# Patient Record
Sex: Male | Born: 1965 | Race: Black or African American | Hispanic: No | Marital: Single | Smoking: Current every day smoker
Health system: Southern US, Community
[De-identification: ages and names within clinical notes are randomized; demographics above are authoritative.]

## PROBLEM LIST (undated history)

## (undated) DIAGNOSIS — I10 Essential (primary) hypertension: Secondary | ICD-10-CM

## (undated) HISTORY — PX: HERNIA REPAIR: SHX51

---

## 2017-09-30 ENCOUNTER — Emergency Department (HOSPITAL_COMMUNITY)
Admission: EM | Admit: 2017-09-30 | Discharge: 2017-09-30 | Disposition: A | Discharge status: Home / Self Care | Payer: Self-pay | Attending: Emergency Medicine | Admitting: Emergency Medicine

## 2017-09-30 ENCOUNTER — Encounter (HOSPITAL_COMMUNITY): Payer: Self-pay | Admitting: Emergency Medicine

## 2017-09-30 ENCOUNTER — Other Ambulatory Visit: Payer: Self-pay

## 2017-09-30 DIAGNOSIS — F17210 Nicotine dependence, cigarettes, uncomplicated: Secondary | ICD-10-CM | POA: Insufficient documentation

## 2017-09-30 DIAGNOSIS — K047 Periapical abscess without sinus: Secondary | ICD-10-CM | POA: Insufficient documentation

## 2017-09-30 DIAGNOSIS — I10 Essential (primary) hypertension: Secondary | ICD-10-CM | POA: Insufficient documentation

## 2017-09-30 DIAGNOSIS — K0889 Other specified disorders of teeth and supporting structures: Secondary | ICD-10-CM

## 2017-09-30 HISTORY — DX: Essential (primary) hypertension: I10

## 2017-09-30 MED ORDER — CLINDAMYCIN HCL 300 MG PO CAPS: 300.0000 mg | ORAL_CAPSULE | Freq: Three times a day (TID) | ORAL | 0 refills | Status: DC

## 2017-09-30 MED ORDER — BUPIVACAINE HCL (PF) 0.5 % IJ SOLN
30.0000 mL | Freq: Once | INTRAMUSCULAR | Status: AC
  Administered 2017-09-30: 30 mL

## 2017-09-30 NOTE — ED Notes (Signed)
ED Provider at bedside. 

## 2017-09-30 NOTE — ED Notes (Signed)
Bed: WTR5 Expected date:  Expected time:  Means of arrival:  Comments: 

## 2017-09-30 NOTE — ED Triage Notes (Signed)
Pt [facility] for Left side dental pain for about 2 weeks due to broken tooth.

## 2017-09-30 NOTE — ED Provider Notes (Signed)
[facility]-EMERGENCY DEPT Provider Note   CSN: [id number] Arrival date & time: 09/30/17  [id number]     History   Chief Complaint Chief Complaint  Patient presents with  . Dental Pain    HPI Robert Howe is a 52 y.o. male.  HPI  2 weeks of increasing left upper dental pain after broken tooth. No fevers. Some mild left sided facial swelling. No recent abx. No dental follow up. Pain is severe in severity at this time. No difficulty breathing or swallowing. No other complaints    Past Medical History:  Diagnosis Date  . Hypertension     There are no active problems to display for this patient.   History reviewed. No pertinent surgical history.      Home Medications    Prior to Admission medications   Not on File    Family History No family history on file.  Social History Social History   Tobacco Use  . Smoking status: Current Every Day Smoker    Types: Cigarettes  . Smokeless tobacco: Never Used  Substance Use Topics  . Alcohol use: Not Currently  . Drug use: Not on file     Allergies   Patient has no known allergies.   Review of Systems Review of Systems  All other systems reviewed and are negative.    Physical Exam Updated Vital Signs BP (!) 153/129   Pulse 73   Temp 97.7 F (36.5 C) (Oral)   Resp 16   Ht 5\' 7"  (1.702 m)   Wt 99.8 kg (220 lb)   SpO2 100%   BMI 34.46 kg/m   Physical Exam  Constitutional: He is oriented to person, place, and time. He appears well-developed and well-nourished.  HENT:  Head: Normocephalic.  Tooth number 11 decayed and broken with remnent tooth remaining and tenderness. No gingival swelling or abscess present. Tolerating secretions. Mild left sided facial swelling. Space under his tongue his soft. Anterior neck normal.   Eyes: EOM are normal.  Neck: Normal range of motion.  Pulmonary/Chest: Effort normal.  Abdominal: He exhibits no distension.  Musculoskeletal: Normal range of  motion.  Neurological: He is alert and oriented to person, place, and time.  Psychiatric: He has a normal mood and affect.  Nursing note and vitals reviewed.    ED Treatments / Results  Labs (all labs ordered are listed, but only abnormal results are displayed) Labs Reviewed - No data to display  EKG None  Radiology No results found.  Procedures .Nerve Block Performed by: Azalia Bilisampos, Ania Levay, MD Authorized by: Azalia Bilisampos, Marijose Curington, MD     NERVE BLOCK Performed by: Azalia BilisKevin Franz Svec Consent: Verbal consent obtained. Required items: required blood products, implants, devices, and special equipment available Time out: Immediately prior to procedure a "time out" was called to verify the correct patient, procedure, equipment, support staff and site/side marked as required. Indication: dental pain Nerve block body site: infraorbital nerve Preparation: Patient was prepped and draped in the usual sterile fashion. Needle gauge: 24 G Location technique: anatomical landmarks Local anesthetic: marcaine 0.5% without Anesthetic total: 4 ml Outcome: pain improved Patient tolerance: Patient tolerated the procedure well with no immediate complications.     Medications Ordered in ED Medications  bupivacaine (MARCAINE) 0.5 % injection 30 mL (has no administration in time range)     Initial Impression / Assessment and Plan / ED Course  I have reviewed the triage vital signs and the nursing notes.  Pertinent labs & imaging results that were  available during my care of the patient were reviewed by me and considered in my medical decision making (see chart for details).    Nerve block for pain control. Outpatient dental follow up. Home with abx.   No signs of gingival abscess. Tolerating secretions. Airway patent. No sub lingular swelling    Final Clinical Impressions(s) / ED Diagnoses   Final diagnoses:  None    ED Discharge Orders    None       Azalia Bilis, MD 09/30/17 [phone]

## 2017-09-30 NOTE — Discharge Instructions (Addendum)
Please call a dentist for follow up.  Take the antibiotics as prescribed

## 2018-11-01 ENCOUNTER — Other Ambulatory Visit: Payer: Self-pay

## 2018-11-01 DIAGNOSIS — Z20822 Contact with and (suspected) exposure to covid-19: Secondary | ICD-10-CM

## 2018-11-02 LAB — NOVEL CORONAVIRUS, NAA: SARS-CoV-2, NAA: NOT DETECTED

## 2019-05-28 ENCOUNTER — Encounter (HOSPITAL_COMMUNITY): Payer: Self-pay | Admitting: Emergency Medicine

## 2019-05-28 ENCOUNTER — Emergency Department (HOSPITAL_COMMUNITY)
Admission: EM | Admit: 2019-05-28 | Discharge: 2019-05-28 | Disposition: A | Discharge status: Home / Self Care | Payer: Self-pay | Attending: Emergency Medicine | Admitting: Emergency Medicine

## 2019-05-28 DIAGNOSIS — I10 Essential (primary) hypertension: Secondary | ICD-10-CM | POA: Insufficient documentation

## 2019-05-28 DIAGNOSIS — K0889 Other specified disorders of teeth and supporting structures: Secondary | ICD-10-CM | POA: Insufficient documentation

## 2019-05-28 DIAGNOSIS — F17210 Nicotine dependence, cigarettes, uncomplicated: Secondary | ICD-10-CM | POA: Insufficient documentation

## 2019-05-28 MED ORDER — ACETAMINOPHEN 325 MG PO TABS
325.0000 mg | ORAL_TABLET | Freq: Once | ORAL | Status: AC
Start: 2019-05-28 — End: 2019-05-28
  Administered 2019-05-28: 325 mg via ORAL
  Filled 2019-05-28: qty 1

## 2019-05-28 MED ORDER — OXYCODONE-ACETAMINOPHEN 5-325 MG PO TABS
1.0000 | ORAL_TABLET | Freq: Once | ORAL | Status: AC
  Administered 2019-05-28: 1 via ORAL
  Filled 2019-05-28: qty 1

## 2019-05-28 MED ORDER — CLINDAMYCIN HCL 300 MG PO CAPS: 300.0000 mg | ORAL_CAPSULE | Freq: Three times a day (TID) | ORAL | 0 refills | Status: DC

## 2019-05-28 MED ORDER — CLINDAMYCIN HCL 150 MG PO CAPS
300.0000 mg | ORAL_CAPSULE | Freq: Once | ORAL | Status: AC
  Administered 2019-05-28: 300 mg via ORAL
  Filled 2019-05-28: qty 2

## 2019-05-28 NOTE — ED Notes (Signed)
Pt was discharged from the ED. Pt read and understood discharge paperwork. Pt had vital signs completed. Pt conscious, breathing, and A&Ox4. No distress noted. Pt speaking in complete sentences. Pt ambulated out of the ED with a smooth and steady gait. E-signature not available.  

## 2019-05-28 NOTE — Discharge Instructions (Addendum)
Once you have a tooth get infected once it is only a matter of time until it gets infected and becomes painful again. It is important that you get this fixed to prevent dental pain like this in the future. Please follow the follow-up instructions with the on-call dentist.  In addition for future reference I have given you the dental resource guide.   While in the ED your blood pressure was high.  Please follow up with your primary care doctor or the wellness clinic for repeat evaluation as you may need medication.  High blood pressure can cause long term, potentially serious, damage if left untreated.   Please take Ibuprofen (Advil, motrin) and Tylenol (acetaminophen) to relieve your pain.  You may take up to 600 MG (3 pills) of normal strength ibuprofen every 8 hours as needed.  In between doses of ibuprofen you make take tylenol, up to 1,000 mg (two extra strength pills).  Do not take more than 3,000 mg tylenol in a 24 hour period.  Please check all medication labels as many medications such as pain and cold medications may contain tylenol.  Do not drink alcohol while taking these medications.  Do not take other NSAID'S while taking ibuprofen (such as aleve or naproxen).  Please take ibuprofen with food to decrease stomach upset.  Today you received medications that may make you sleepy or impair your ability to make decisions.  For the next 24 hours please do not drive, operate heavy machinery, care for a small child with out another adult present, or perform any activities that may cause harm to you or someone else if you were to fall asleep or be impaired.   You are being prescribed a medication which may make you sleepy. Please follow up of listed precautions for at least 24 hours after taking one dose.  You may have diarrhea from the antibiotics.  It is very important that you continue to take the antibiotics even if you get diarrhea unless a medical professional tells you that you may stop taking  them.  If you stop too early the bacteria you are being treated for will become stronger and you may need different, more powerful antibiotics that have more side effects and worsening diarrhea.  Please stay well hydrated and consider probiotics as they may decrease the severity of your diarrhea.

## 2019-05-28 NOTE — ED Provider Notes (Signed)
[facility] EMERGENCY DEPARTMENT Provider Note   CSN: [id number] Arrival date & time: 05/28/19  1836     History Chief Complaint  Patient presents with  . Dental Pain    Robert Howe is a 54 y.o. male with a past medical history of hypertension who presents today for evaluation of left upper dental pain.  He reports the pain started yesterday and has progressed throughout the day.  He states he has tried ibuprofen without resolution of his pain. Chart review shows that he was seen for similar 7/19, he states that this is the same tooth and feels the same.  He did not follow-up with a dentist after the last time. He denies any fevers.  No difficulty swallowing or breathing.  No chest pain, or neck pain.    HPI     Past Medical History:  Diagnosis Date  . Hypertension     There are no problems to display for this patient.   History reviewed. No pertinent surgical history.     No family history on file.  Social History   Tobacco Use  . Smoking status: Current Every Day Smoker    Types: Cigarettes  . Smokeless tobacco: Never Used  Substance Use Topics  . Alcohol use: Not Currently  . Drug use: Not on file    Home Medications Prior to Admission medications   Medication Sig Start Date End Date Taking? Authorizing Provider  clindamycin (CLEOCIN) 300 MG capsule Take 1 capsule (300 mg total) by mouth 3 (three) times daily. 05/28/19   Cristina Gong, PA-C    Allergies    Penicillins  Review of Systems   Review of Systems  Constitutional: Negative for chills and fever.  HENT: Positive for dental problem. Negative for drooling, trouble swallowing and voice change.   Respiratory: Negative for shortness of breath.   Gastrointestinal: Negative for abdominal pain.  Musculoskeletal: Negative for back pain.  Neurological: Negative for weakness and headaches.    Physical Exam Updated Vital Signs BP (!) 152/98 (BP Location: Right Arm)   Pulse  98   Temp 98.2 F (36.8 C)   Resp 18   SpO2 98%   Physical Exam Vitals and nursing note reviewed.  Constitutional:      Comments: Appears uncomfortable  HENT:     Head: Normocephalic and atraumatic.     Comments: There is no visualized facial swelling.  No elevation of the floor the mouth or submandibular swelling.      Left Ear: Tympanic membrane, ear canal and external ear normal.     Mouth/Throat:     Mouth: Mucous membranes are moist.     Dentition: No gingival swelling or dental abscesses.     Pharynx: Oropharynx is clear. Uvula midline. No pharyngeal swelling, posterior oropharyngeal erythema or uvula swelling.     Comments: Teeth are in generally poor state of dentition with multiple broken and carious teeth.  He has pain along his left maxillary molars and premolars. Cardiovascular:     Rate and Rhythm: Normal rate.  Pulmonary:     Effort: Pulmonary effort is normal. No respiratory distress.  Musculoskeletal:     Cervical back: Full passive range of motion without pain, normal range of motion and neck supple.  Lymphadenopathy:     Cervical: No cervical adenopathy.  Skin:    General: Skin is warm and dry.  Neurological:     Mental Status: He is alert.     Comments: Patient is awake and  alert.  No evidence of ataxia or difficulties with walking.  Psychiatric:        Mood and Affect: Mood normal.     ED Results / Procedures / Treatments   Labs (all labs ordered are listed, but only abnormal results are displayed) Labs Reviewed - No data to display  EKG None  Radiology No results found.  Procedures Procedures (including critical care time)  Medications Ordered in ED Medications  oxyCODONE-acetaminophen (PERCOCET/ROXICET) 5-325 MG per tablet 1 tablet (1 tablet Oral Given 05/28/19 2105)  clindamycin (CLEOCIN) capsule 300 mg (300 mg Oral Given 05/28/19 2104)  acetaminophen (TYLENOL) tablet 325 mg (325 mg Oral Given 05/28/19 2105)    ED Course  I have reviewed  the triage vital signs and the nursing notes.  Pertinent labs & imaging results that were available during my care of the patient were reviewed by me and considered in my medical decision making (see chart for details).    MDM Rules/Calculators/A&P                     Patient is a 54 year old man who presents today for evaluation of approximately 24 hours of dental pain.  On exam he does not have any significant facial swelling.  He does not have difficulty handling his secretions and is able to swallow pills in the emergency room without difficulty.  He appears to be uncomfortable however is otherwise well-appearing.  Teeth are in poor state.  He had a similar issue in the same tooth in 2019. Given sudden exacerbation and pain suspect underlying dental infection.  He does not have any facial rash that would be suggestive of shingles at this time.   In the emergency room he is treated with Percocet and Tylenol as he states he has recently taken NSAIDs.  He is afebrile not tachycardic or tachypneic.  He is also given his first dose of clindamycin as he anaphylaxis to penicillins. He is noted to be hypertensive here.  This was discussed with him along with the need for follow-up. He is given the information for the dentist on-call along with follow-up instructions.  Return precautions were discussed with patient who states their understanding.  At the time of discharge patient denied any unaddressed complaints or concerns.  Patient is agreeable for discharge home.  Note: Portions of this report may have been transcribed using voice recognition software. Every effort was made to ensure accuracy; however, inadvertent computerized transcription errors may be present   Final Clinical Impression(s) / ED Diagnoses Final diagnoses:  Pain, dental  Hypertension, unspecified type    Rx / DC Orders ED Discharge Orders         Ordered    clindamycin (CLEOCIN) 300 MG capsule  3 times daily     05/28/19  2059           Lorin Glass, [place] 05/29/19 0059    Nat Christen, MD 05/29/19 1452

## 2019-05-28 NOTE — ED Triage Notes (Signed)
Pt here with pain to the upper left mouth , pt has a bad tooth in  That area

## 2019-08-22 ENCOUNTER — Encounter (HOSPITAL_COMMUNITY): Payer: Self-pay | Admitting: Emergency Medicine

## 2019-08-22 ENCOUNTER — Inpatient Hospital Stay (HOSPITAL_COMMUNITY)
Admission: EM | Admit: 2019-08-22 | Discharge: 2019-08-24 | DRG: 390 | Disposition: A | Discharge status: Home / Self Care | Payer: Self-pay | Attending: Internal Medicine | Admitting: Internal Medicine

## 2019-08-22 DIAGNOSIS — Z880 Allergy status to penicillin: Secondary | ICD-10-CM

## 2019-08-22 DIAGNOSIS — D751 Secondary polycythemia: Secondary | ICD-10-CM

## 2019-08-22 DIAGNOSIS — K913 Postprocedural intestinal obstruction, unspecified as to partial versus complete: Secondary | ICD-10-CM

## 2019-08-22 DIAGNOSIS — Z20822 Contact with and (suspected) exposure to covid-19: Secondary | ICD-10-CM | POA: Diagnosis present

## 2019-08-22 DIAGNOSIS — K56609 Unspecified intestinal obstruction, unspecified as to partial versus complete obstruction: Secondary | ICD-10-CM | POA: Diagnosis present

## 2019-08-22 DIAGNOSIS — Z79899 Other long term (current) drug therapy: Secondary | ICD-10-CM

## 2019-08-22 DIAGNOSIS — Z8249 Family history of ischemic heart disease and other diseases of the circulatory system: Secondary | ICD-10-CM

## 2019-08-22 DIAGNOSIS — D7589 Other specified diseases of blood and blood-forming organs: Secondary | ICD-10-CM | POA: Diagnosis present

## 2019-08-22 DIAGNOSIS — R17 Unspecified jaundice: Secondary | ICD-10-CM

## 2019-08-22 DIAGNOSIS — K5651 Intestinal adhesions [bands], with partial obstruction: Principal | ICD-10-CM | POA: Diagnosis present

## 2019-08-22 DIAGNOSIS — K56600 Partial intestinal obstruction, unspecified as to cause: Secondary | ICD-10-CM

## 2019-08-22 DIAGNOSIS — K2950 Unspecified chronic gastritis without bleeding: Secondary | ICD-10-CM | POA: Diagnosis present

## 2019-08-22 DIAGNOSIS — F17210 Nicotine dependence, cigarettes, uncomplicated: Secondary | ICD-10-CM | POA: Diagnosis present

## 2019-08-22 DIAGNOSIS — I10 Essential (primary) hypertension: Secondary | ICD-10-CM | POA: Diagnosis present

## 2019-08-22 LAB — COMPREHENSIVE METABOLIC PANEL
ALT: 37 U/L (ref 0–44)
AST: 18 U/L (ref 15–41)
Albumin: 4.5 g/dL (ref 3.5–5.0)
Alkaline Phosphatase: 49 U/L (ref 38–126)
Anion gap: 13 (ref 5–15)
BUN: 19 mg/dL (ref 6–20)
CO2: 25 mmol/L (ref 22–32)
Calcium: 9.9 mg/dL (ref 8.9–10.3)
Chloride: 99 mmol/L (ref 98–111)
Creatinine, Ser: 0.95 mg/dL (ref 0.61–1.24)
GFR calc Af Amer: >60 mL/min (ref >60)
GFR calc non Af Amer: >60 mL/min (ref >60)
Glucose, Bld: 112 mg/dL — ABNORMAL HIGH (ref 70–99)
Potassium: 4.6 mmol/L (ref 3.5–5.1)
Sodium: 137 mmol/L (ref 135–145)
Total Bilirubin: 1.6 mg/dL — ABNORMAL HIGH (ref 0.3–1.2)
Total Protein: 9.3 g/dL — ABNORMAL HIGH (ref 6.5–8.1)

## 2019-08-22 LAB — CBC
HCT: 55.2 % — ABNORMAL HIGH (ref 39.0–52.0)
Hemoglobin: 18.4 g/dL — ABNORMAL HIGH (ref 13.0–17.0)
MCH: 34.3 pg — ABNORMAL HIGH (ref 26.0–34.0)
MCHC: 33.3 g/dL (ref 30.0–36.0)
MCV: 103 fL — ABNORMAL HIGH (ref 80.0–100.0)
Platelets: 234 10*3/uL (ref 150–400)
RBC: 5.36 MIL/uL (ref 4.22–5.81)
RDW: 12.2 % (ref 11.5–15.5)
WBC: 5 10*3/uL (ref 4.0–10.5)
nRBC: 0 % (ref 0.0–0.2)

## 2019-08-22 LAB — LIPASE, BLOOD: Lipase: 23 U/L (ref 11–51)

## 2019-08-22 MED ORDER — SODIUM CHLORIDE 0.9% FLUSH
3.0000 mL | Freq: Once | INTRAVENOUS | Status: AC
  Administered 2019-08-23: 3 mL via INTRAVENOUS

## 2019-08-22 MED ORDER — MORPHINE SULFATE (PF) 4 MG/ML IV SOLN
4.0000 mg | Freq: Once | INTRAVENOUS | Status: AC
  Administered 2019-08-23: 4 mg via INTRAVENOUS
  Filled 2019-08-22: qty 1

## 2019-08-22 MED ORDER — ONDANSETRON HCL 4 MG/2ML IJ SOLN
4.0000 mg | Freq: Once | INTRAMUSCULAR | Status: AC
  Administered 2019-08-23: 4 mg via INTRAVENOUS
  Filled 2019-08-22: qty 2

## 2019-08-22 NOTE — ED Notes (Signed)
Unable to collect labs PT currently not in lobby

## 2019-08-22 NOTE — ED Provider Notes (Signed)
[facility]-EMERGENCY DEPT Provider Note   CSN: [id number] Arrival date & time: 08/22/19  1540   History Chief Complaint  Patient presents with  . Abdominal Pain    Robert Howe is a 54 y.o. male.  The history is provided by the patient.  Abdominal Pain He has history of hypertension and comes in complaining of abdominal pain for the last 2 days.  Pain is in the mid abdomen without radiation.  It is severe and he rates it at 9/10.  It is worse if he lays on either side, somewhat better if he lays on his back.  There is associated nausea and vomiting.  There is no blood in the emesis.  There is no relief of pain with emesis.  He denies having any bowel movements or passing any flatus.  He does have history of 2 abdominal surgeries for hernia repair.  He denies fever, chills, sweats.  He did take a dose of oxycodone without relief.  Past Medical History:  Diagnosis Date  . Hypertension     There are no problems to display for this patient.   History reviewed. No pertinent surgical history.     No family history on file.  Social History   Tobacco Use  . Smoking status: Current Every Day Smoker    Types: Cigarettes  . Smokeless tobacco: Never Used  Substance Use Topics  . Alcohol use: Not Currently  . Drug use: Not on file    Home Medications Prior to Admission medications   Medication Sig Start Date End Date Taking? Authorizing Provider  clindamycin (CLEOCIN) 300 MG capsule Take 1 capsule (300 mg total) by mouth 3 (three) times daily. 05/28/19   Cristina Gong, PA-C    Allergies    Penicillins  Review of Systems   Review of Systems  Gastrointestinal: Positive for abdominal pain.  All other systems reviewed and are negative.   Physical Exam Updated Vital Signs BP (!) 139/92   Pulse 94   Temp 98.9 F (37.2 C) (Oral)   Resp 17   Ht 5\' 7"  (1.702 m)   Wt 95 kg   SpO2 98%   BMI 32.80 kg/m   Physical Exam Vitals and nursing  note reviewed.   54 year old male, resting comfortably and in no acute distress. Vital signs are significant for borderline elevated blood pressure. Oxygen saturation is 98%, which is normal. Head is normocephalic and atraumatic. PERRLA, EOMI. Oropharynx is clear. Neck is nontender and supple without adenopathy or JVD. Back is nontender and there is no CVA tenderness. Lungs are clear without rales, wheezes, or rhonchi. Chest is nontender. Heart has regular rate and rhythm without murmur. Abdomen is soft, flat, with mild to moderate tenderness rather diffusely.  There is no rebound or guarding.  There are no masses or hepatosplenomegaly and peristalsis is hypoactive. Extremities have no cyanosis or edema, full range of motion is present. Skin is warm and dry without rash. Neurologic: Mental status is normal, cranial nerves are intact, there are no motor or sensory deficits.  ED Results / Procedures / Treatments   Labs (all labs ordered are listed, but only abnormal results are displayed) Labs Reviewed  COMPREHENSIVE METABOLIC PANEL - Abnormal; Notable for the following components:      Result Value   Glucose, Bld 112 (*)    Total Protein 9.3 (*)    Total Bilirubin 1.6 (*)    All other components within normal limits  CBC - Abnormal;  Notable for the following components:   Hemoglobin 18.4 (*)    HCT 55.2 (*)    MCV 103.0 (*)    MCH 34.3 (*)    All other components within normal limits  SARS CORONAVIRUS 2 BY RT PCR (HOSPITAL ORDER, PERFORMED IN [facility])  LIPASE, BLOOD  URINALYSIS, ROUTINE W REFLEX MICROSCOPIC   Radiology CT ABDOMEN PELVIS W CONTRAST  Result Date: 08/23/2019 CLINICAL DATA:  Bowel obstruction suspected EXAM: CT ABDOMEN AND PELVIS WITH CONTRAST TECHNIQUE: Multidetector CT imaging of the abdomen and pelvis was performed using the standard protocol following bolus administration of intravenous contrast. CONTRAST:  OMNIPAQUE IOHEXOL 300 MG/ML  SOLN  COMPARISON:  None FINDINGS: Lower chest: Atelectatic changes in the otherwise clear lung bases. Small fat containing right Bochdalek's hernia. Normal heart size. No pericardial effusion. Hepatobiliary: Diffuse hepatic hypoattenuation compatible with hepatic steatosis. Sparing along the gallbladder fossa smooth liver surface contour. No concerning focal liver lesions. Normal gallbladder and biliary tree without visible calcified gallstone. Pancreas: Insert pancreas Spleen: Normal in size without focal abnormality. Adrenals/Urinary Tract: Normal adrenal glands. Kidneys are normally located with symmetric enhancement and excretion. There are multiple small fluid attenuation probable simple cyst as well as additional subcentimeter hypertension foci in both kidneys too small to fully characterize on CT imaging but statistically likely benign. No suspicious renal lesion, urolithiasis or hydronephrosis. Bladder is unremarkable aside from indentation of the bladder base by a borderline enlarged prostate. Stomach/Bowel: Distal esophagus unremarkable. There is slight prominence of the rugal folds with some intramural fat noted in the stomach and slight mucosal hyperemia. Can be seen in the setting of chronic gastritis. Duodenum is normal. Portion of the proximal small bowel is normal caliber but with abrupt distention beginning in the left lower quadrant (2/49 and proceeding through multiple contiguous loops of air and fluid-filled small bowel with transition to distal decompressed small bowel loops which appear closely apposed to a prior ventral mesh hernia repair (2/48) which raises concern for possible bowel obstruction secondary to adhesion. Several of the decompressed loops in this immediate vicinity demonstrates some mild mural thickening and mucosal hyperenhancement as well (5/67), nonspecific. More distal small bowel is unremarkable. A normal appendix is visualized. No colonic dilatation or wall thickening.  Vascular/Lymphatic: Atherosclerotic calcifications throughout the abdominal aorta and branch vessels. No aneurysm or ectasia. No enlarged abdominopelvic lymph nodes. Reproductive: Mild prostatomegaly. No other concerning CT abnormality the prostate or seminal vesicles. Other: Postsurgical changes from prior ventral incision with abdominal hernia mesh repair. Multiple apposed loops of small bowel potentially with site of obstruction secondary to adhesion. Musculoskeletal: No acute osseous abnormality or suspicious osseous lesion. Multilevel degenerative changes are present in the imaged portions of the spine. 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 Slight levocurvature of the lumbar spine. IMPRESSION: 1. Contiguous loops of air and fluid distended small bowel with suspected transition point at the level of several mildly thickened, decompressed small loops closely apposed to a prior ventral mesh hernia repair. Appearance is suspicious for small bowel obstruction secondary to adhesion. 2. Several of the decompressed loops in this immediate vicinity of the transition point demonstrate mural thickening and mucosal hyperemia which is a nonspecific appearance though possibly infectious or inflammatory in nature. No pneumatosis or portal venous gas 3. Slight prominence of the rugal folds with some intramural fat noted in the stomach and slight mucosal  hyperemia. Can be seen in the setting of chronic gastritis. 4. Hepatic steatosis. 5. Mild prostatomegaly. 6. 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 Left hip degeneration is more mild-to-moderate. 7. Aortic Atherosclerosis (ICD10-I70.0). Electronically Signed   By: Lovena Le M.D.   On: 08/23/2019 02:35    Procedures Procedures   Medications Ordered in  ED Medications  sodium chloride flush (NS) 0.9 % injection 3 mL (has no administration in time range)  morphine 4 MG/ML injection 4 mg (has no administration in time range)  ondansetron (ZOFRAN) injection 4 mg (has no administration in time range)    ED Course  I have reviewed the triage vital signs and the nursing notes.  Pertinent labs & imaging results that were available during my care of the patient were reviewed by me and considered in my medical decision making (see chart for details).  MDM Rules/Calculators/A&P Abdominal pain with vomiting worrisome for small bowel obstruction.  Consider diverticulitis.  Doubt appendicitis, cholecystitis, pancreatitis.  This differential does include conditions with high morbidity.  Labs obtained at triage are significant for polycythemia with hemoglobin 18.4, and elevated bilirubin of 1.6.  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  Old records are reviewed and he has no relevant past visits, no prior lab tests.  He will be sent for CT of abdomen and pelvis.  CT scan shows small bowel obstruction, probably secondary to adhesions.  Case is discussed with Dr. Hal Hope of [facility], who agrees to admit the patient.  Final Clinical Impression(s) / ED Diagnoses Final diagnoses:  None    Rx / DC Orders ED Discharge Orders    None       Delora Fuel, MD 67/61/95 [phone]

## 2019-08-22 NOTE — ED Triage Notes (Signed)
Pt c/o abd pains with n/v for 2 days. No BM in couple days. Denies urinary problems.

## 2019-08-23 ENCOUNTER — Encounter (HOSPITAL_COMMUNITY): Payer: Self-pay | Admitting: Internal Medicine

## 2019-08-23 ENCOUNTER — Inpatient Hospital Stay (HOSPITAL_COMMUNITY): Payer: PRIVATE HEALTH INSURANCE

## 2019-08-23 ENCOUNTER — Emergency Department (HOSPITAL_COMMUNITY): Payer: PRIVATE HEALTH INSURANCE

## 2019-08-23 ENCOUNTER — Other Ambulatory Visit: Payer: Self-pay

## 2019-08-23 DIAGNOSIS — K56609 Unspecified intestinal obstruction, unspecified as to partial versus complete obstruction: Secondary | ICD-10-CM | POA: Diagnosis present

## 2019-08-23 LAB — URINALYSIS, ROUTINE W REFLEX MICROSCOPIC
Bacteria, UA: NONE SEEN
Bilirubin Urine: NEGATIVE
Glucose, UA: NEGATIVE mg/dL
Hgb urine dipstick: NEGATIVE
Ketones, ur: 5 mg/dL — AB
Leukocytes,Ua: NEGATIVE
Nitrite: NEGATIVE
Protein, ur: 30 mg/dL — AB
Specific Gravity, Urine: >1.046 — ABNORMAL HIGH (ref 1.005–1.030)
pH: 5 (ref 5.0–8.0)

## 2019-08-23 LAB — COMPREHENSIVE METABOLIC PANEL
ALT: 32 U/L (ref 0–44)
AST: 13 U/L — ABNORMAL LOW (ref 15–41)
Albumin: 4 g/dL (ref 3.5–5.0)
Alkaline Phosphatase: 40 U/L (ref 38–126)
Anion gap: 9 (ref 5–15)
BUN: 19 mg/dL (ref 6–20)
CO2: 25 mmol/L (ref 22–32)
Calcium: 9.1 mg/dL (ref 8.9–10.3)
Chloride: 101 mmol/L (ref 98–111)
Creatinine, Ser: 0.78 mg/dL (ref 0.61–1.24)
GFR calc Af Amer: >60 mL/min (ref >60)
GFR calc non Af Amer: >60 mL/min (ref >60)
Glucose, Bld: 123 mg/dL — ABNORMAL HIGH (ref 70–99)
Potassium: 4.3 mmol/L (ref 3.5–5.1)
Sodium: 135 mmol/L (ref 135–145)
Total Bilirubin: 1.7 mg/dL — ABNORMAL HIGH (ref 0.3–1.2)
Total Protein: 8.5 g/dL — ABNORMAL HIGH (ref 6.5–8.1)

## 2019-08-23 LAB — CBC WITH DIFFERENTIAL/PLATELET
Abs Immature Granulocytes: 0.01 10*3/uL (ref 0.00–0.07)
Basophils Absolute: 0 10*3/uL (ref 0.0–0.1)
Basophils Relative: 0 %
Eosinophils Absolute: 0 10*3/uL (ref 0.0–0.5)
Eosinophils Relative: 1 %
HCT: 50 % (ref 39.0–52.0)
Hemoglobin: 16.7 g/dL (ref 13.0–17.0)
Immature Granulocytes: 0 %
Lymphocytes Relative: 47 %
Lymphs Abs: 2 10*3/uL (ref 0.7–4.0)
MCH: 34.3 pg — ABNORMAL HIGH (ref 26.0–34.0)
MCHC: 33.4 g/dL (ref 30.0–36.0)
MCV: 102.7 fL — ABNORMAL HIGH (ref 80.0–100.0)
Monocytes Absolute: 0.7 10*3/uL (ref 0.1–1.0)
Monocytes Relative: 15 %
Neutro Abs: 1.6 10*3/uL — ABNORMAL LOW (ref 1.7–7.7)
Neutrophils Relative %: 37 %
Platelets: 210 10*3/uL (ref 150–400)
RBC: 4.87 MIL/uL (ref 4.22–5.81)
RDW: 12.3 % (ref 11.5–15.5)
WBC: 4.4 10*3/uL (ref 4.0–10.5)
nRBC: 0 % (ref 0.0–0.2)

## 2019-08-23 LAB — HIV ANTIBODY (ROUTINE TESTING W REFLEX): HIV Screen 4th Generation wRfx: NONREACTIVE

## 2019-08-23 LAB — SARS CORONAVIRUS 2 BY RT PCR (HOSPITAL ORDER, PERFORMED IN ~~LOC~~ HOSPITAL LAB): SARS Coronavirus 2: NEGATIVE

## 2019-08-23 MED ORDER — MORPHINE SULFATE (PF) 2 MG/ML IV SOLN: 1.0000 mg | INTRAVENOUS | Status: DC | PRN

## 2019-08-23 MED ORDER — MORPHINE SULFATE (PF) 2 MG/ML IV SOLN
1.0000 mg | INTRAVENOUS | Status: DC | PRN
  Administered 2019-08-23 – 2019-08-24 (×6): 1 mg via INTRAVENOUS
  Filled 2019-08-23 (×6): qty 1

## 2019-08-23 MED ORDER — DIATRIZOATE MEGLUMINE & SODIUM 66-10 % PO SOLN
90.0000 mL | Freq: Once | ORAL | Status: AC
  Administered 2019-08-23: 90 mL via ORAL
  Filled 2019-08-23: qty 90

## 2019-08-23 MED ORDER — MORPHINE SULFATE (PF) 2 MG/ML IV SOLN
INTRAVENOUS | Status: AC
  Administered 2019-08-23: 1 mg via INTRAVENOUS
  Filled 2019-08-23: qty 1

## 2019-08-23 MED ORDER — DEXTROSE-NACL 5-0.9 % IV SOLN: INTRAVENOUS | Status: AC

## 2019-08-23 MED ORDER — HYDRALAZINE HCL 20 MG/ML IJ SOLN: 10.0000 mg | INTRAMUSCULAR | Status: DC | PRN

## 2019-08-23 MED ORDER — ONDANSETRON HCL 4 MG/2ML IJ SOLN: 4.0000 mg | Freq: Four times a day (QID) | INTRAMUSCULAR | Status: DC | PRN

## 2019-08-23 MED ORDER — SODIUM CHLORIDE (PF) 0.9 % IJ SOLN
INTRAMUSCULAR | Status: AC
  Filled 2019-08-23: qty 50

## 2019-08-23 MED ORDER — IOHEXOL 300 MG/ML  SOLN
100.0000 mL | Freq: Once | INTRAMUSCULAR | Status: AC | PRN
  Administered 2019-08-23: 100 mL via INTRAVENOUS

## 2019-08-23 MED ORDER — ONDANSETRON HCL 4 MG PO TABS: 4.0000 mg | ORAL_TABLET | Freq: Four times a day (QID) | ORAL | Status: DC | PRN

## 2019-08-23 NOTE — Consult Note (Signed)
[facility] Surgery Consult Note  Robert Howe 1966/03/04  [id number].    Requesting MD: Toniann Fail Chief Complaint/Reason for Consult: SBO HPI:  Patient is a 54 year old male who presented to [facility] with abdominal pain and bloating since Monday. Patient has a hx of two ventral hernia repairs done at the [place] and recurrent SBO. Patient reports he had to watch what he ate prior to hernia repairs as well and that if he ate certain things both prior to and after surgery he would develop obstructive symptoms. He does not report a specific chronicity of these symptoms but denies them happening monthly. He usually manages this at home but came to the ED due to worsening pain around umbilicus. He reported last BM was Monday morning and there was nothing unusual about it that he noted. Reported associated abdominal bloating and nausea/vomiting. Currently he is feeling much better and passing some flatus. He now denies pain and reports distention feels improved. PMH otherwise significant for HTN. Allergic to PCNs. No other past abdominal surgery. He works as an Personnel officer for [employer]   ROS: Review of Systems  Constitutional: Negative for chills and fever.  Respiratory: Negative for shortness of breath and wheezing.   Cardiovascular: Negative for chest pain and palpitations.  Gastrointestinal: Positive for constipation. Negative for abdominal pain, blood in stool, diarrhea, melena, nausea and vomiting.  Genitourinary: Negative for dysuria, frequency and urgency.  All other systems reviewed and are negative.   Family History  Problem Relation Age of Onset  . Hypertension Mother     Past Medical History:  Diagnosis Date  . Hypertension     Past Surgical History:  Procedure Laterality Date  . HERNIA REPAIR      Social History:  reports that he has been smoking cigarettes. He has never used smokeless tobacco. He reports previous alcohol use. No history on file for drug.  Allergies:   Allergies  Allergen Reactions  . Penicillins Anaphylaxis    Medications Prior to Admission  Medication Sig Dispense Refill  . clindamycin (CLEOCIN) 300 MG capsule Take 1 capsule (300 mg total) by mouth 3 (three) times daily. (Patient not taking: Reported on 08/23/2019) 21 capsule 0    Blood pressure 137/83, pulse 75, temperature 98.1 F (36.7 C), temperature source Oral, resp. rate 18, height 5\' 7"  (1.702 m), weight 95 kg, SpO2 92 %. Physical Exam:  General: pleasant, WD, WN AA male who is sitting in chair in NAD HEENT: Sclera are noninjected.  PERRL.  Ears and nose without any masses or lesions.  Mouth is pink and moist Heart: regular, rate, and rhythm.  Normal s1,s2. No obvious murmurs, gallops, or rubs noted.  Palpable radial and pedal pulses bilaterally Lungs: CTAB, no wheezes, rhonchi, or rales noted.  Respiratory effort nonlabored Abd: soft, NT, moderately distended, BS hypoactive, surgical scars MS: all 4 extremities are symmetrical with no cyanosis, clubbing, or edema. Skin: warm and dry with no masses, lesions, or rashes Neuro: Cranial nerves 2-12 grossly intact, sensation grossly intact  Psych: A&Ox3 with an appropriate affect.   Results for orders placed or performed during the hospital encounter of 08/22/19 (from the past 48 hour(s))  Lipase, blood     Status: None   Collection Time: 08/22/19  8:40 PM  Result Value Ref Range   Lipase 23 11 - 51 U/L    Comment: Performed at [facility], [address] [place], [place]  Comprehensive metabolic panel     Status: Abnormal  Collection Time: 08/22/19  8:40 PM  Result Value Ref Range   Sodium 137 135 - 145 mmol/L   Potassium 4.6 3.5 - 5.1 mmol/L   Chloride 99 98 - 111 mmol/L   CO2 25 22 - 32 mmol/L   Glucose, Bld 112 (H) 70 - 99 mg/dL    Comment: Glucose reference range applies only to samples taken after fasting for at least 8 hours.   BUN 19 6 - 20 mg/dL   Creatinine, Ser 0.95 0.61 - 1.24  mg/dL   Calcium 9.9 8.9 - 10.3 mg/dL   Total Protein 9.3 (H) 6.5 - 8.1 g/dL   Albumin 4.5 3.5 - 5.0 g/dL   AST 18 15 - 41 U/L   ALT 37 0 - 44 U/L   Alkaline Phosphatase 49 38 - 126 U/L   Total Bilirubin 1.6 (H) 0.3 - 1.2 mg/dL   GFR calc non Af Amer >60 >60 mL/min   GFR calc Af Amer >60 >60 mL/min   Anion gap 13 5 - 15    Comment: Performed at [facility], [place] [address], [place], [place] [address]  CBC     Status: Abnormal   Collection Time: 08/22/19  8:40 PM  Result Value Ref Range   WBC 5.0 4.0 - 10.5 K/uL   RBC 5.36 4.22 - 5.81 MIL/uL   Hemoglobin 18.4 (H) 13.0 - 17.0 g/dL   HCT 55.2 (H) 39.0 - 52.0 %   MCV 103.0 (H) 80.0 - 100.0 fL   MCH 34.3 (H) 26.0 - 34.0 pg   MCHC 33.3 30.0 - 36.0 g/dL   RDW 12.2 11.5 - 15.5 %   Platelets 234 150 - 400 K/uL   nRBC 0.0 0.0 - 0.2 %    Comment: Performed at [facility], [place] [address] [place], [place] [address]  SARS Coronavirus 2 by RT PCR (hospital order, performed in [facility] hospital lab) Nasopharyngeal Nasopharyngeal Swab     Status: None   Collection Time: 08/23/19  2:44 AM   Specimen: Nasopharyngeal Swab  Result Value Ref Range   SARS Coronavirus 2 NEGATIVE NEGATIVE    Comment: (NOTE) SARS-CoV-2 target nucleic acids are NOT DETECTED. The SARS-CoV-2 RNA is generally detectable in upper and lower respiratory specimens during the acute phase of infection. The lowest concentration of SARS-CoV-2 viral copies this assay can detect is 250 copies / mL. A negative result does not preclude SARS-CoV-2 infection and should not be used as the sole basis for treatment or other patient management decisions.  A negative result may occur with improper specimen collection / handling, submission of specimen other than nasopharyngeal swab, presence of viral mutation(s) within the areas targeted by this assay, and inadequate number of viral copies (<250 copies / mL). A negative result must be combined with  clinical observations, patient history, and epidemiological information. Fact Sheet for Patients:   StrictlyIdeas.no Fact Sheet for Healthcare Providers: BankingDealers.co.za This test is not yet approved or cleared  by the [place] FDA and has been authorized for detection and/or diagnosis of SARS-CoV-2 by FDA under an Emergency Use Authorization (EUA).  This EUA will remain in effect (meaning this test can be used) for the duration of the COVID-19 declaration under Section 564(b)(1) of the Act, 21 U.S.C. section 360bbb-3(b)(1), unless the authorization is terminated or revoked sooner. Performed at [facility], [place] [address] [place], [place] [address]   Urinalysis, Routine w reflex microscopic     Status: Abnormal  Collection Time: 08/23/19  4:20 AM  Result Value Ref Range   Color, Urine AMBER (A) YELLOW   APPearance CLEAR CLEAR   Specific Gravity, Urine >1.046 (H) 1.005 - 1.030   pH 5.0 5.0 - 8.0   Glucose, UA NEGATIVE NEGATIVE mg/dL   Hgb urine dipstick NEGATIVE NEGATIVE   Bilirubin Urine NEGATIVE NEGATIVE   Ketones, ur 5 (A) NEGATIVE mg/dL   Protein, ur 30 (A) NEGATIVE mg/dL   Nitrite NEGATIVE NEGATIVE   Leukocytes,Ua NEGATIVE NEGATIVE   RBC / HPF 0-5 0 - 5 RBC/hpf   WBC, UA 0-5 0 - 5 WBC/hpf   Bacteria, UA NONE SEEN NONE SEEN   Squamous Epithelial / LPF 0-5 0 - 5   Mucus PRESENT     Comment: Performed at [facility], [address] [place], [place] [address]  Comprehensive metabolic panel     Status: Abnormal   Collection Time: 08/23/19  7:36 AM  Result Value Ref Range   Sodium 135 135 - 145 mmol/L   Potassium 4.3 3.5 - 5.1 mmol/L   Chloride 101 98 - 111 mmol/L   CO2 25 22 - 32 mmol/L   Glucose, Bld 123 (H) 70 - 99 mg/dL    Comment: Glucose reference range applies only to samples taken after fasting for at least 8 hours.   BUN 19 6 - 20 mg/dL   Creatinine, Ser 1.94 0.61 -  1.24 mg/dL   Calcium 9.1 8.9 - 17.4 mg/dL   Total Protein 8.5 (H) 6.5 - 8.1 g/dL   Albumin 4.0 3.5 - 5.0 g/dL   AST 13 (L) 15 - 41 U/L   ALT 32 0 - 44 U/L   Alkaline Phosphatase 40 38 - 126 U/L   Total Bilirubin 1.7 (H) 0.3 - 1.2 mg/dL   GFR calc non Af Amer >60 >60 mL/min   GFR calc Af Amer >60 >60 mL/min   Anion gap 9 5 - 15    Comment: Performed at [facility], [address], [place], [place] [address]  CBC WITH DIFFERENTIAL     Status: Abnormal   Collection Time: 08/23/19  7:36 AM  Result Value Ref Range   WBC 4.4 4.0 - 10.5 K/uL   RBC 4.87 4.22 - 5.81 MIL/uL   Hemoglobin 16.7 13.0 - 17.0 g/dL   HCT 81.8 56.3 - 14.9 %   MCV 102.7 (H) 80.0 - 100.0 fL   MCH 34.3 (H) 26.0 - 34.0 pg   MCHC 33.4 30.0 - 36.0 g/dL   RDW 70.2 63.7 - 85.8 %   Platelets 210 150 - 400 K/uL   nRBC 0.0 0.0 - 0.2 %   Neutrophils Relative % 37 %   Neutro Abs 1.6 (L) 1.7 - 7.7 K/uL   Lymphocytes Relative 47 %   Lymphs Abs 2.0 0.7 - 4.0 K/uL   Monocytes Relative 15 %   Monocytes Absolute 0.7 0.1 - 1.0 K/uL   Eosinophils Relative 1 %   Eosinophils Absolute 0.0 0.0 - 0.5 K/uL   Basophils Relative 0 %   Basophils Absolute 0.0 0.0 - 0.1 K/uL   Immature Granulocytes 0 %   Abs Immature Granulocytes 0.01 0.00 - 0.07 K/uL    Comment: Performed at [facility], [address] [place], [place] [address]   CT ABDOMEN PELVIS W CONTRAST  Result Date: 08/23/2019 CLINICAL DATA:  Bowel obstruction suspected EXAM: CT ABDOMEN AND PELVIS WITH CONTRAST TECHNIQUE: Multidetector CT imaging of the abdomen and  pelvis was performed using the standard protocol following bolus administration of intravenous contrast. CONTRAST:  100mL OMNIPAQUE IOHEXOL 300 MG/ML  SOLN COMPARISON:  None FINDINGS: Lower chest: Atelectatic changes in the otherwise clear lung bases. Small fat containing right Bochdalek's hernia. Normal heart size. No pericardial effusion. Hepatobiliary: Diffuse hepatic  hypoattenuation compatible with hepatic steatosis. Sparing along the gallbladder fossa smooth liver surface contour. No concerning focal liver lesions. Normal gallbladder and biliary tree without visible calcified gallstone. Pancreas: Insert pancreas Spleen: Normal in size without focal abnormality. Adrenals/Urinary Tract: Normal adrenal glands. Kidneys are normally located with symmetric enhancement and excretion. There are multiple small fluid attenuation probable simple cyst as well as additional subcentimeter hypertension foci in both kidneys too small to fully characterize on CT imaging but statistically likely benign. No suspicious renal lesion, urolithiasis or hydronephrosis. Bladder is unremarkable aside from indentation of the bladder base by a borderline enlarged prostate. Stomach/Bowel: Distal esophagus unremarkable. There is slight prominence of the rugal folds with some intramural fat noted in the stomach and slight mucosal hyperemia. Can be seen in the setting of chronic gastritis. Duodenum is normal. Portion of the proximal small bowel is normal caliber but with abrupt distention beginning in the left lower quadrant (2/49 and proceeding through multiple contiguous loops of air and fluid-filled small bowel with transition to distal decompressed small bowel loops which appear closely apposed to a prior ventral mesh hernia repair (2/48) which raises concern for possible bowel obstruction secondary to adhesion. Several of the decompressed loops in this immediate vicinity demonstrates some mild mural thickening and mucosal hyperenhancement as well (5/67), nonspecific. More distal small bowel is unremarkable. A normal appendix is visualized. No colonic dilatation or wall thickening. Vascular/Lymphatic: Atherosclerotic calcifications throughout the abdominal aorta and branch vessels. No aneurysm or ectasia. No enlarged abdominopelvic lymph nodes. Reproductive: Mild prostatomegaly. No other concerning CT  abnormality the prostate or seminal vesicles. Other: Postsurgical changes from prior ventral incision with abdominal hernia mesh repair. Multiple apposed loops of small bowel potentially with site of obstruction secondary to adhesion. Musculoskeletal: No acute osseous abnormality or suspicious osseous lesion. Multilevel degenerative changes are present in the imaged portions of the spine. 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 Slight levocurvature of the lumbar spine. IMPRESSION: 1. Contiguous loops of air and fluid distended small bowel with suspected transition point at the level of several mildly thickened, decompressed small loops closely apposed to a prior ventral mesh hernia repair. Appearance is suspicious for small bowel obstruction secondary to adhesion. 2. Several of the decompressed loops in this immediate vicinity of the transition point demonstrate mural thickening and mucosal hyperemia which is a nonspecific appearance though possibly infectious or inflammatory in nature. No pneumatosis or portal venous gas 3. Slight prominence of the rugal folds with some intramural fat noted in the stomach and slight mucosal hyperemia. Can be seen in the setting of chronic gastritis. 4. Hepatic steatosis. 5. Mild prostatomegaly. 6. 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 Left hip degeneration is more mild-to-moderate. 7. Aortic Atherosclerosis (ICD10-I70.0). Electronically Signed   By: Kreg ShropshirePrice  DeHay M.D.   On: 08/23/2019 02:35      Assessment/Plan HTN  Hx of 2 prior ventral hernia repairs - at least one with mesh, both done [place], most recent surgery was in 2015 and was laparoscopic SBO -  CT yesterday showed sbo with some thickened bowel loops, chronic gastritis - patient reports he is feeling better  this AM, passing flatus and feels less bloated - recommend PO gastrografin for small bowel protocol with film this AM for baseline and then 8h film - ok to have ice chips and sips with meds for now - ambulate  FEN: NPO, IVF VTE: SCDs ID: no current abx  Juliet Rude, [facility] Surgery 08/23/2019, 9:31 AM Please see Amion for pager number during day hours 7:00am-4:30pm

## 2019-08-23 NOTE — Progress Notes (Signed)
54 yo M presenting with SBO. pMN admission. See H&P for full details. Currently on small bowel protocol w/o NGT. Gastrografin test ordered w/ follow testing for this afternoon. Surgery onboard. Appreciate assistance. Patient states he is feeling better. Continue otherwise as per H&P.    General: 54 y.o. male resting in bed in NAD Cardiovascular: RRR, +S1, S2, no m/g/r, equal pulses throughout Respiratory: CTABL, no w/r/r, normal WOB GI: BS+, ND, LUQ TTP mild,  no masses noted, no organomegaly noted MSK: No e/c/c Neuro: A&O x 3, no focal deficits Psyc: Appropriate interaction and affect, calm/cooperative  Teddy Spike, DO

## 2019-08-23 NOTE — H&P (Signed)
History and Physical    Robert Howe HQI:[id number] DOB: 03-26-1965 DOA: 08/22/2019  PCP: Patient, No Pcp Per  Patient coming from: Home.  Chief Complaint: Abdominal pain.  HPI: Robert Howe is a 54 y.o. male with history of hypertension previous history of laparoscopic abdominal hernia repair and also has history of recurrent small bowel obstructions presents to the ER with complaint of worsening abdominal pain over the last 3 days mostly centered around his umbilicus with nausea and vomiting last bowel movement was 3 days ago.  Denies any blood in the vomitus.  ED Course: In the ER CT abdomen pelvis shows features concerning for small bowel obstruction and patient is to be admitted for further management.  Patient was given morphine for pain relief.  Labs show polycythemia with macrocytosis Covid test is pending.  Review of Systems: As per HPI, rest all negative.   Past Medical History:  Diagnosis Date  . Hypertension     Past Surgical History:  Procedure Laterality Date  . HERNIA REPAIR       reports that he has been smoking cigarettes. He has never used smokeless tobacco. He reports previous alcohol use. No history on file for drug.  Allergies  Allergen Reactions  . Penicillins Anaphylaxis    Family History  Problem Relation Age of Onset  . Hypertension Mother     Prior to Admission medications   Medication Sig Start Date End Date Taking? Authorizing Provider  clindamycin (CLEOCIN) 300 MG capsule Take 1 capsule (300 mg total) by mouth 3 (three) times daily. Patient not taking: Reported on 08/23/2019 05/28/19   Cristina Gong, PA-C    Physical Exam: Constitutional: Moderately built and nourished. Vitals:   08/22/19 1550 08/22/19 2033 08/23/19 0010  BP: (!) 168/113 (!) 139/92 133/83  Pulse: 95 94 90  Resp: 19 17 18   Temp: 98.9 F (37.2 C)    TempSrc: Oral    SpO2: 98% 98% 92%  Weight: 95 kg    Height: 5\' 7"  (1.702 m)     Eyes: Anicteric no  pallor. ENMT: No discharge from the ears eyes nose or mouth. Neck: No mass felt.  No neck rigidity. Respiratory: No rhonchi or crepitations. Cardiovascular: S1-S2 heard. Abdomen: Soft mildly distended nontender bowel sounds present. Musculoskeletal: No edema. Skin: No rash. Neurologic: Alert awake oriented to time place and person.  Moves all extremities. Psychiatric: Appears normal per normal affect.   Labs on Admission: I have personally reviewed following labs and imaging studies  CBC: Recent Labs  Lab 08/22/19 2040  WBC 5.0  HGB 18.4*  HCT 55.2*  MCV 103.0*  PLT 234   Basic Metabolic Panel: Recent Labs  Lab 08/22/19 2040  NA 137  K 4.6  CL 99  CO2 25  GLUCOSE 112*  BUN 19  CREATININE 0.95  CALCIUM 9.9   GFR: Estimated Creatinine Clearance: 97.7 mL/min (by C-G formula based on SCr of 0.95 mg/dL). Liver Function Tests: Recent Labs  Lab 08/22/19 2040  AST 18  ALT 37  ALKPHOS 49  BILITOT 1.6*  PROT 9.3*  ALBUMIN 4.5   Recent Labs  Lab 08/22/19 2040  LIPASE 23   No results for input(s): AMMONIA in the last 168 hours. Coagulation Profile: No results for input(s): INR, PROTIME in the last 168 hours. Cardiac Enzymes: No results for input(s): CKTOTAL, CKMB, CKMBINDEX, TROPONINI in the last 168 hours. BNP (last 3 results) No results for input(s): PROBNP in the last 8760 hours. HbA1C: No results  for input(s): HGBA1C in the last 72 hours. CBG: No results for input(s): GLUCAP in the last 168 hours. Lipid Profile: No results for input(s): CHOL, HDL, LDLCALC, TRIG, CHOLHDL, LDLDIRECT in the last 72 hours. Thyroid Function Tests: No results for input(s): TSH, T4TOTAL, FREET4, T3FREE, THYROIDAB in the last 72 hours. Anemia Panel: No results for input(s): VITAMINB12, FOLATE, FERRITIN, TIBC, IRON, RETICCTPCT in the last 72 hours. Urine analysis: No results found for: COLORURINE, APPEARANCEUR, LABSPEC, PHURINE, GLUCOSEU, HGBUR, BILIRUBINUR, KETONESUR,  PROTEINUR, UROBILINOGEN, NITRITE, LEUKOCYTESUR Sepsis Labs: @LABRCNTIP (procalcitonin:4,lacticidven:4) )No results found for this or any previous visit (from the past 240 hour(s)).   Radiological Exams on Admission: CT ABDOMEN PELVIS W CONTRAST  Result Date: 08/23/2019 CLINICAL DATA:  Bowel obstruction suspected EXAM: CT ABDOMEN AND PELVIS WITH CONTRAST TECHNIQUE: Multidetector CT imaging of the abdomen and pelvis was performed using the standard protocol following bolus administration of intravenous contrast. CONTRAST:  10/23/2019 OMNIPAQUE IOHEXOL 300 MG/ML  SOLN COMPARISON:  None FINDINGS: Lower chest: Atelectatic changes in the otherwise clear lung bases. Small fat containing right Bochdalek's hernia. Normal heart size. No pericardial effusion. Hepatobiliary: Diffuse hepatic hypoattenuation compatible with hepatic steatosis. Sparing along the gallbladder fossa smooth liver surface contour. No concerning focal liver lesions. Normal gallbladder and biliary tree without visible calcified gallstone. Pancreas: Insert pancreas Spleen: Normal in size without focal abnormality. Adrenals/Urinary Tract: Normal adrenal glands. Kidneys are normally located with symmetric enhancement and excretion. There are multiple small fluid attenuation probable simple cyst as well as additional subcentimeter hypertension foci in both kidneys too small to fully characterize on CT imaging but statistically likely benign. No suspicious renal lesion, urolithiasis or hydronephrosis. Bladder is unremarkable aside from indentation of the bladder base by a borderline enlarged prostate. Stomach/Bowel: Distal esophagus unremarkable. There is slight prominence of the rugal folds with some intramural fat noted in the stomach and slight mucosal hyperemia. Can be seen in the setting of chronic gastritis. Duodenum is normal. Portion of the proximal small bowel is normal caliber but with abrupt distention beginning in the left lower quadrant (2/49 and  proceeding through multiple contiguous loops of air and fluid-filled small bowel with transition to distal decompressed small bowel loops which appear closely apposed to a prior ventral mesh hernia repair (2/48) which raises concern for possible bowel obstruction secondary to adhesion. Several of the decompressed loops in this immediate vicinity demonstrates some mild mural thickening and mucosal hyperenhancement as well (5/67), nonspecific. More distal small bowel is unremarkable. A normal appendix is visualized. No colonic dilatation or wall thickening. Vascular/Lymphatic: Atherosclerotic calcifications throughout the abdominal aorta and branch vessels. No aneurysm or ectasia. No enlarged abdominopelvic lymph nodes. Reproductive: Mild prostatomegaly. No other concerning CT abnormality the prostate or seminal vesicles. Other: Postsurgical changes from prior ventral incision with abdominal hernia mesh repair. Multiple apposed loops of small bowel potentially with site of obstruction secondary to adhesion. Musculoskeletal: No acute osseous abnormality or suspicious osseous lesion. Multilevel degenerative changes are present in the imaged portions of the spine. 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 Slight levocurvature of the lumbar spine. IMPRESSION: 1. Contiguous loops of air and fluid distended small bowel with suspected transition point at the level of several mildly thickened, decompressed small loops closely apposed to a prior ventral mesh hernia repair. Appearance is suspicious for small bowel obstruction secondary to adhesion. 2. Several of the decompressed loops in this immediate  vicinity of the transition point demonstrate mural thickening and mucosal hyperemia which is a nonspecific appearance though possibly infectious or inflammatory in nature. No pneumatosis or portal  venous gas 3. Slight prominence of the rugal folds with some intramural fat noted in the stomach and slight mucosal hyperemia. Can be seen in the setting of chronic gastritis. 4. Hepatic steatosis. 5. Mild prostatomegaly. 6. 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 Left hip degeneration is more mild-to-moderate. 7. Aortic Atherosclerosis (ICD10-I70.0). Electronically Signed   By: Lovena Le M.D.   On: 08/23/2019 02:35     Assessment/Plan Active Problems:   SBO (small bowel obstruction) ([facility])    1. Small bowel obstruction -could be from medications from previous surgery.  We will keep patient n.p.o. and if there is any further episodes of vomiting will place patient NG tube.  Check KUB in the morning.  General surgery consult.  Pain relief medication IV fluids. 2. History of hypertension has not been taking medicines for many months now we will keep patient on as needed IV hydralazine closely for blood pressure trends. 3. Macrocytosis.  Will need anemia panel with next blood draw. 4. Tobacco abuse.  Advised about quitting.  Patient states he drinks alcohol only twice a week.  Given that patient has small bowel obstruction will need more than 2 midnight stay in inpatient status.  Covid test is pending.   DVT prophylaxis: SCDs.  Avoiding pharmacological DVT prophylaxis in anticipation of possible procedure given that patient has small bowel obstruction. Code Status: Full code. Family Communication: Discussed with patient. Disposition Plan: Home. Consults called: Will consult surgery. Admission status: Inpatient.   Rise Patience MD [facility] Pager [phone].  If 7PM-7AM, please contact night-coverage [email or web address] Password [email or web address]  08/23/2019, 3:51 AM

## 2019-08-24 DIAGNOSIS — R809 Proteinuria, unspecified: Secondary | ICD-10-CM

## 2019-08-24 DIAGNOSIS — Z720 Tobacco use: Secondary | ICD-10-CM

## 2019-08-24 DIAGNOSIS — D7589 Other specified diseases of blood and blood-forming organs: Secondary | ICD-10-CM

## 2019-08-24 MED ORDER — IBUPROFEN 200 MG PO TABS
400.0000 mg | ORAL_TABLET | Freq: Four times a day (QID) | ORAL | Status: DC | PRN
  Filled 2019-08-24: qty 2

## 2019-08-24 MED ORDER — DOCUSATE SODIUM 100 MG PO CAPS: 100.0000 mg | ORAL_CAPSULE | Freq: Two times a day (BID) | ORAL | Status: DC

## 2019-08-24 MED ORDER — ONDANSETRON HCL 4 MG PO TABS: 4.0000 mg | ORAL_TABLET | Freq: Four times a day (QID) | ORAL | 0 refills | Status: AC | PRN

## 2019-08-24 MED ORDER — POLYETHYLENE GLYCOL 3350 17 G PO PACK: 17.0000 g | PACK | Freq: Every day | ORAL | Status: DC | PRN

## 2019-08-24 MED ORDER — DOCUSATE SODIUM 100 MG PO CAPS: 100.0000 mg | ORAL_CAPSULE | Freq: Two times a day (BID) | ORAL | 0 refills | Status: AC

## 2019-08-24 NOTE — Discharge Summary (Signed)
Physician Discharge Summary  Robert Howe DTO:[id number] DOB: 12-May-1965 DOA: 08/22/2019  PCP: Patient, No Pcp Per  Admit date: 08/22/2019 Discharge date: 08/24/2019  Admitted From: Home Disposition:  Discharged to home.   Recommendations for Outpatient Follow-up:  1. Follow up with PCP in 1-2 weeks    Discharge Condition: Stable  CODE STATUS: FULL  Diet recommendation: FLD for 1 - 2 days, then can increase.   Brief/Interim Summary: Robert Howe is a 54 y.o. male with history of hypertension previous history of laparoscopic abdominal hernia repair and also has history of recurrent small bowel obstructions presents to the ER with complaint of worsening abdominal pain over the last 3 days mostly centered around his umbilicus with nausea and vomiting last bowel movement was 3 days ago.  Denies any blood in the vomitus.  In the ER CT abdomen pelvis shows features concerning for small bowel obstruction and patient is to be admitted for further management.  Patient was given morphine for pain relief.  Labs show polycythemia with macrocytosis Covid test is pending.  6/10: S/p gastrografin. Last exam show contrast in the colon. Patient seen eating potato chips this morning. Says bowels have returned to normal. Will send with stool softener. Needs to take it slow on diet over the net couple of days. Follow up with PCP in 1 week.   Discharge Diagnoses:  Active Problems:   SBO (small bowel obstruction) ([facility])  Small bowel obstruction     - eval'd gen surgery. S/p gastrografin studies.     - no further nausea/pain     - eating chips this morning, says bowels have returned to normal     - surgery has s/o'd; ok for discharge.   History of hypertension      - has not been taking medicines for many months now     - BP ok, follow up with PCP  Macrocytosis     - follow up outpt  Tobacco abuse     - counseled against further use  Proteinuria     - follow up with PCP  Discharge  Instructions   Allergies as of 08/24/2019      Reactions   Penicillins Anaphylaxis      Medication List    STOP taking these medications   clindamycin 300 MG capsule Commonly known as: CLEOCIN     TAKE these medications   docusate sodium 100 MG capsule Commonly known as: COLACE Take 1 capsule (100 mg total) by mouth 2 (two) times daily for 7 days.   ondansetron 4 MG tablet Commonly known as: ZOFRAN Take 1 tablet (4 mg total) by mouth every 6 (six) hours as needed for up to 5 days for nausea or vomiting.       Allergies  Allergen Reactions  . Penicillins Anaphylaxis    Consultations:  General Surgery  Procedures/Studies: CT ABDOMEN PELVIS W CONTRAST  Result Date: 08/23/2019 CLINICAL DATA:  Bowel obstruction suspected EXAM: CT ABDOMEN AND PELVIS WITH CONTRAST TECHNIQUE: Multidetector CT imaging of the abdomen and pelvis was performed using the standard protocol following bolus administration of intravenous contrast. CONTRAST:  OMNIPAQUE IOHEXOL 300 MG/ML  SOLN COMPARISON:  None FINDINGS: Lower chest: Atelectatic changes in the otherwise clear lung bases. Small fat containing right Bochdalek's hernia. Normal heart size. No pericardial effusion. Hepatobiliary: Diffuse hepatic hypoattenuation compatible with hepatic steatosis. Sparing along the gallbladder fossa smooth liver surface contour. No concerning focal liver lesions. Normal gallbladder and biliary tree without visible calcified gallstone.  Pancreas: Insert pancreas Spleen: Normal in size without focal abnormality. Adrenals/Urinary Tract: Normal adrenal glands. Kidneys are normally located with symmetric enhancement and excretion. There are multiple small fluid attenuation probable simple cyst as well as additional subcentimeter hypertension foci in both kidneys too small to fully characterize on CT imaging but statistically likely benign. No suspicious renal lesion, urolithiasis or hydronephrosis. Bladder is unremarkable  aside from indentation of the bladder base by a borderline enlarged prostate. Stomach/Bowel: Distal esophagus unremarkable. There is slight prominence of the rugal folds with some intramural fat noted in the stomach and slight mucosal hyperemia. Can be seen in the setting of chronic gastritis. Duodenum is normal. Portion of the proximal small bowel is normal caliber but with abrupt distention beginning in the left lower quadrant (2/49 and proceeding through multiple contiguous loops of air and fluid-filled small bowel with transition to distal decompressed small bowel loops which appear closely apposed to a prior ventral mesh hernia repair (2/48) which raises concern for possible bowel obstruction secondary to adhesion. Several of the decompressed loops in this immediate vicinity demonstrates some mild mural thickening and mucosal hyperenhancement as well (5/67), nonspecific. More distal small bowel is unremarkable. A normal appendix is visualized. No colonic dilatation or wall thickening. Vascular/Lymphatic: Atherosclerotic calcifications throughout the abdominal aorta and branch vessels. No aneurysm or ectasia. No enlarged abdominopelvic lymph nodes. Reproductive: Mild prostatomegaly. No other concerning CT abnormality the prostate or seminal vesicles. Other: Postsurgical changes from prior ventral incision with abdominal hernia mesh repair. Multiple apposed loops of small bowel potentially with site of obstruction secondary to adhesion. Musculoskeletal: No acute osseous abnormality or suspicious osseous lesion. Multilevel degenerative changes are present in the imaged portions of the spine. 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 Slight levocurvature of the lumbar spine. IMPRESSION: 1. Contiguous loops of air and fluid distended small bowel with suspected transition point at the  level of several mildly thickened, decompressed small loops closely apposed to a prior ventral mesh hernia repair. Appearance is suspicious for small bowel obstruction secondary to adhesion. 2. Several of the decompressed loops in this immediate vicinity of the transition point demonstrate mural thickening and mucosal hyperemia which is a nonspecific appearance though possibly infectious or inflammatory in nature. No pneumatosis or portal venous gas 3. Slight prominence of the rugal folds with some intramural fat noted in the stomach and slight mucosal hyperemia. Can be seen in the setting of chronic gastritis. 4. Hepatic steatosis. 5. Mild prostatomegaly. 6. 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 Left hip degeneration is more mild-to-moderate. 7. Aortic Atherosclerosis (ICD10-I70.0). Electronically Signed   By: Kreg Shropshire M.D.   On: 08/23/2019 02:35   DG Abd Portable 1V-Small Bowel Obstruction Protocol-initial, 8 hr delay  Result Date: 08/23/2019 CLINICAL DATA:  Evaluate small bowel obstruction. EXAM: PORTABLE ABDOMEN - 1 VIEW COMPARISON:  Earlier films, same date. FINDINGS: The contrast has made it into the colon suggesting a partial small bowel obstruction. There are persistent moderately dilated small bowel loops measuring up to 4.8 cm. No free air. IMPRESSION: Findings suggest a partial small bowel obstruction. The contrast has reached the colon. Electronically Signed   By: Rudie Meyer M.D.   On: 08/23/2019 19:45   DG Abd Portable 1V  Result Date: 08/23/2019 CLINICAL DATA:  Abdominal pain EXAM: PORTABLE ABDOMEN - 1 VIEW COMPARISON:  CT from earlier in the same day. FINDINGS: Persistent small bowel dilatation is noted similar to that seen on the prior CT examination. Contrast is noted within the bladder related to the prior CT. No acute bony abnormality is noted. No free air is seen. IMPRESSION: Persistent small-bowel obstruction  Electronically Signed   By: Alcide Clever M.D.   On: 08/23/2019 12:46      Subjective: "I'm fine. I'm ready to go."  Discharge Exam: Vitals:   08/23/19 2043 08/24/19 0555  BP: 121/67 134/86  Pulse: 68 (!) 58  Resp: 16 16  Temp: 98.2 F (36.8 C) 97.8 F (36.6 C)  SpO2: 92% 91%   Vitals:   08/23/19 0645 08/23/19 1434 08/23/19 2043 08/24/19 0555  BP: 137/83 (!) 128/92 121/67 134/86  Pulse: 75 69 68 (!) 58  Resp: 18 18 16 16   Temp: 98.1 F (36.7 C) (!) 97.5 F (36.4 C) 98.2 F (36.8 C) 97.8 F (36.6 C)  TempSrc: Oral Oral Oral Oral  SpO2: 92% 95% 92% 91%  Weight:      Height:        General: 54 y.o. male resting in bed in NAD Cardiovascular: RRR, +S1, S2, no m/g/r, equal pulses throughout Respiratory: CTABL, no w/r/r, normal WOB GI: BS+, slight distention, NT, no masses noted, no organomegaly noted MSK: No e/c/c Neuro: A&O x 3, no focal deficits Psyc: Appropriate interaction and affect, calm/cooperative   The results of significant diagnostics from this hospitalization (including imaging, microbiology, ancillary and laboratory) are listed below for reference.     Microbiology: Recent Results (from the past 240 hour(s))  SARS Coronavirus 2 by RT PCR (hospital order, performed in [facility] hospital lab) Nasopharyngeal Nasopharyngeal Swab     Status: None   Collection Time: 08/23/19  2:44 AM   Specimen: Nasopharyngeal Swab  Result Value Ref Range Status   SARS Coronavirus 2 NEGATIVE NEGATIVE Final    Comment: (NOTE) SARS-CoV-2 target nucleic acids are NOT DETECTED. The SARS-CoV-2 RNA is generally detectable in upper and lower respiratory specimens during the acute phase of infection. The lowest concentration of SARS-CoV-2 viral copies this assay can detect is 250 copies / mL. A negative result does not preclude SARS-CoV-2 infection and should not be used as the sole basis for treatment or other patient management decisions.  A negative result may occur  with improper specimen collection / handling, submission of specimen other than nasopharyngeal swab, presence of viral mutation(s) within the areas targeted by this assay, and inadequate number of viral copies (<250 copies / mL). A negative result must be combined with clinical observations, patient history, and epidemiological information. Fact Sheet for Patients:   10/23/19 Fact Sheet for Healthcare Providers: BoilerBrush.com.cy This test is not yet approved or cleared  by the https://pope.com/ FDA and has been authorized for detection and/or diagnosis of SARS-CoV-2 by FDA under an Emergency Use Authorization (EUA).  This EUA will remain in effect (meaning this test can be used) for the duration of the COVID-19 declaration under Section 564(b)(1) of the Act, 21 U.S.C. section 360bbb-3(b)(1), unless the authorization is terminated or revoked sooner. Performed at [facility], [address], [place], [place]      Labs: BNP (last 3 results) No results for input(s): BNP in the last 8760 hours. Basic Metabolic Panel: Recent Labs  Lab 08/22/19 2040 08/23/19 0736  NA 137 135  K 4.6 4.3  CL 99 101  CO2 25 25  GLUCOSE 112* 123*  BUN 19 19  CREATININE 0.95 0.78  CALCIUM 9.9 9.1   Liver Function Tests: Recent Labs  Lab 08/22/19 2040 08/23/19 0736  AST 18 13*  ALT 37 32  ALKPHOS 49 40  BILITOT 1.6* 1.7*  PROT 9.3* 8.5*  ALBUMIN 4.5 4.0   Recent Labs  Lab 08/22/19 2040  LIPASE 23   No results for input(s): AMMONIA in the last 168 hours. CBC: Recent Labs  Lab 08/22/19 2040 08/23/19 0736  WBC 5.0 4.4  NEUTROABS  --  1.6*  HGB 18.4* 16.7  HCT 55.2* 50.0  MCV 103.0* 102.7*  PLT 234 210   Cardiac Enzymes: No results for input(s): CKTOTAL, CKMB, CKMBINDEX, TROPONINI in the last 168 hours. BNP: Invalid input(s): POCBNP CBG: No results for input(s): GLUCAP in the last 168  hours. D-Dimer No results for input(s): DDIMER in the last 72 hours. Hgb A1c No results for input(s): HGBA1C in the last 72 hours. Lipid Profile No results for input(s): CHOL, HDL, LDLCALC, TRIG, CHOLHDL, LDLDIRECT in the last 72 hours. Thyroid function studies No results for input(s): TSH, T4TOTAL, T3FREE, THYROIDAB in the last 72 hours.  Invalid input(s): FREET3 Anemia work up No results for input(s): VITAMINB12, FOLATE, FERRITIN, TIBC, IRON, RETICCTPCT in the last 72 hours. Urinalysis    Component Value Date/Time   COLORURINE AMBER (A) 08/23/2019 0420   APPEARANCEUR CLEAR 08/23/2019 0420   LABSPEC >1.046 (H) 08/23/2019 0420   PHURINE 5.0 08/23/2019 0420   GLUCOSEU NEGATIVE 08/23/2019 0420   HGBUR NEGATIVE 08/23/2019 0420   BILIRUBINUR NEGATIVE 08/23/2019 0420   KETONESUR 5 (A) 08/23/2019 0420   PROTEINUR 30 (A) 08/23/2019 0420   NITRITE NEGATIVE 08/23/2019 0420   LEUKOCYTESUR NEGATIVE 08/23/2019 0420   Sepsis Labs Invalid input(s): PROCALCITONIN,  WBC,  LACTICIDVEN Microbiology Recent Results (from the past 240 hour(s))  SARS Coronavirus 2 by RT PCR (hospital order, performed in [place] hospital lab) Nasopharyngeal Nasopharyngeal Swab     Status: None   Collection Time: 08/23/19  2:44 AM   Specimen: Nasopharyngeal Swab  Result Value Ref Range Status   SARS Coronavirus 2 NEGATIVE NEGATIVE Final    Comment: (NOTE) SARS-CoV-2 target nucleic acids are NOT DETECTED. The SARS-CoV-2 RNA is generally detectable in upper and lower respiratory specimens during the acute phase of infection. The lowest concentration of SARS-CoV-2 viral copies this assay can detect is 250 copies / mL. A negative result does not preclude SARS-CoV-2 infection and should not be used as the sole basis for treatment or other patient management decisions.  A negative result may occur with improper specimen collection / handling, submission of specimen other than nasopharyngeal swab, presence of  viral mutation(s) within the areas targeted by this assay, and inadequate number of viral copies (<250 copies / mL). A negative result must be combined with clinical observations, patient history, and epidemiological information. Fact Sheet for Patients:   StrictlyIdeas.no Fact Sheet for Healthcare Providers: BankingDealers.co.za This test is not yet approved or cleared  by the [place] FDA and has been authorized for detection and/or diagnosis of SARS-CoV-2 by FDA under an Emergency Use Authorization (EUA).  This EUA will remain in effect (meaning this test can be used) for the duration of the COVID-19 declaration under Section 564(b)(1) of the Act, 21 U.S.C. section 360bbb-3(b)(1), unless the authorization is terminated or revoked sooner. Performed at [facility], [place] [address] [place], [place] [address]      Time coordinating discharge: 35 minutes  SIGNED:   Jiles Prows  A Ronaldo MiyamotoKyle, DO  [facility] 08/24/2019, 1:46 PM   If 7PM-7AM, please contact night-coverage [email or web address]

## 2019-08-24 NOTE — Progress Notes (Signed)
[facility] Progress Note     Subjective: Patient is sitting in chair eating a bag of potato chips. Reports he feels like his bowel function is back to normal this AM. Denies abdominal pain or nausea. We discussed home bowel regimen and consideration of daily stool softener and/laxatives prn.   Objective: Vital signs in last 24 hours: Temp:  [97.5 F (36.4 C)-98.2 F (36.8 C)] 97.8 F (36.6 C) (06/10 0555) Pulse Rate:  [58-69] 58 (06/10 0555) Resp:  [16-18] 16 (06/10 0555) BP: (121-134)/(67-92) 134/86 (06/10 0555) SpO2:  [91 %-95 %] 91 % (06/10 0555) Last BM Date: 08/23/19  Intake/Output from previous day: 06/09 0701 - 06/10 0700 In: 793.5 [I.V.:793.5] Out: -  Intake/Output this shift: No intake/output data recorded.  PE: General: pleasant, WD, WN AA male who is sitting in chair in NAD HEENT: Sclera are noninjected.  PERRL.  Ears and nose without any masses or lesions.  Mouth is pink and moist Heart: regular, rate, and rhythm.  Normal s1,s2. No obvious murmurs, gallops, or rubs noted.  Palpable radial and pedal pulses bilaterally Lungs: CTAB, no wheezes, rhonchi, or rales noted.  Respiratory effort nonlabored Abd: soft, NT, moderately distended, +BS, surgical scars MS: all 4 extremities are symmetrical with no cyanosis, clubbing, or edema. Skin: warm and dry with no masses, lesions, or rashes Neuro: Cranial nerves 2-12 grossly intact, sensation grossly intact  Psych: A&Ox3 with an appropriate affect.    Lab Results:  Recent Labs    08/22/19 2040 08/23/19 0736  WBC 5.0 4.4  HGB 18.4* 16.7  HCT 55.2* 50.0  PLT 234 210   BMET Recent Labs    08/22/19 2040 08/23/19 0736  NA 137 135  K 4.6 4.3  CL 99 101  CO2 25 25  GLUCOSE 112* 123*  BUN 19 19  CREATININE 0.95 0.78  CALCIUM 9.9 9.1   PT/INR No results for input(s): LABPROT, INR in the last 72 hours. CMP     Component Value Date/Time   NA 135 08/23/2019 0736   K 4.3 08/23/2019 0736   CL 101  08/23/2019 0736   CO2 25 08/23/2019 0736   GLUCOSE 123 (H) 08/23/2019 0736   BUN 19 08/23/2019 0736   CREATININE 0.78 08/23/2019 0736   CALCIUM 9.1 08/23/2019 0736   PROT 8.5 (H) 08/23/2019 0736   ALBUMIN 4.0 08/23/2019 0736   AST 13 (L) 08/23/2019 0736   ALT 32 08/23/2019 0736   ALKPHOS 40 08/23/2019 0736   BILITOT 1.7 (H) 08/23/2019 0736   GFRNONAA >60 08/23/2019 0736   GFRAA >60 08/23/2019 0736   Lipase     Component Value Date/Time   LIPASE 23 08/22/2019 2040       Studies/Results: CT ABDOMEN PELVIS W CONTRAST  Result Date: 08/23/2019 CLINICAL DATA:  Bowel obstruction suspected EXAM: CT ABDOMEN AND PELVIS WITH CONTRAST TECHNIQUE: Multidetector CT imaging of the abdomen and pelvis was performed using the standard protocol following bolus administration of intravenous contrast. CONTRAST:  OMNIPAQUE IOHEXOL 300 MG/ML  SOLN COMPARISON:  None FINDINGS: Lower chest: Atelectatic changes in the otherwise clear lung bases. Small fat containing right Bochdalek's hernia. Normal heart size. No pericardial effusion. Hepatobiliary: Diffuse hepatic hypoattenuation compatible with hepatic steatosis. Sparing along the gallbladder fossa smooth liver surface contour. No concerning focal liver lesions. Normal gallbladder and biliary tree without visible calcified gallstone. Pancreas: Insert pancreas Spleen: Normal in size without focal abnormality. Adrenals/Urinary Tract: Normal adrenal glands. Kidneys are normally located with symmetric enhancement and excretion.  There are multiple small fluid attenuation probable simple cyst as well as additional subcentimeter hypertension foci in both kidneys too small to fully characterize on CT imaging but statistically likely benign. No suspicious renal lesion, urolithiasis or hydronephrosis. Bladder is unremarkable aside from indentation of the bladder base by a borderline enlarged prostate. Stomach/Bowel: Distal esophagus unremarkable. There is slight  prominence of the rugal folds with some intramural fat noted in the stomach and slight mucosal hyperemia. Can be seen in the setting of chronic gastritis. Duodenum is normal. Portion of the proximal small bowel is normal caliber but with abrupt distention beginning in the left lower quadrant (2/49 and proceeding through multiple contiguous loops of air and fluid-filled small bowel with transition to distal decompressed small bowel loops which appear closely apposed to a prior ventral mesh hernia repair (2/48) which raises concern for possible bowel obstruction secondary to adhesion. Several of the decompressed loops in this immediate vicinity demonstrates some mild mural thickening and mucosal hyperenhancement as well (5/67), nonspecific. More distal small bowel is unremarkable. A normal appendix is visualized. No colonic dilatation or wall thickening. Vascular/Lymphatic: Atherosclerotic calcifications throughout the abdominal aorta and branch vessels. No aneurysm or ectasia. No enlarged abdominopelvic lymph nodes. Reproductive: Mild prostatomegaly. No other concerning CT abnormality the prostate or seminal vesicles. Other: Postsurgical changes from prior ventral incision with abdominal hernia mesh repair. Multiple apposed loops of small bowel potentially with site of obstruction secondary to adhesion. Musculoskeletal: No acute osseous abnormality or suspicious osseous lesion. Multilevel degenerative changes are present in the imaged portions of the spine. 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 Slight levocurvature of the lumbar spine. IMPRESSION: 1. Contiguous loops of air and fluid distended small bowel with suspected transition point at the level of several mildly thickened, decompressed small loops closely apposed to a prior ventral mesh hernia repair. Appearance is suspicious  for small bowel obstruction secondary to adhesion. 2. Several of the decompressed loops in this immediate vicinity of the transition point demonstrate mural thickening and mucosal hyperemia which is a nonspecific appearance though possibly infectious or inflammatory in nature. No pneumatosis or portal venous gas 3. Slight prominence of the rugal folds with some intramural fat noted in the stomach and slight mucosal hyperemia. Can be seen in the setting of chronic gastritis. 4. Hepatic steatosis. 5. Mild prostatomegaly. 6. 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 Left hip degeneration is more mild-to-moderate. 7. Aortic Atherosclerosis (ICD10-I70.0). Electronically Signed   By: Kreg Shropshire M.D.   On: 08/23/2019 02:35   DG Abd Portable 1V-Small Bowel Obstruction Protocol-initial, 8 hr delay  Result Date: 08/23/2019 CLINICAL DATA:  Evaluate small bowel obstruction. EXAM: PORTABLE ABDOMEN - 1 VIEW COMPARISON:  Earlier films, same date. FINDINGS: The contrast has made it into the colon suggesting a partial small bowel obstruction. There are persistent moderately dilated small bowel loops measuring up to 4.8 cm. No free air. IMPRESSION: Findings suggest a partial small bowel obstruction. The contrast has reached the colon. Electronically Signed   By: Rudie Meyer M.D.   On: 08/23/2019 19:45   DG Abd Portable 1V  Result Date: 08/23/2019 CLINICAL DATA:  Abdominal pain EXAM: PORTABLE ABDOMEN - 1 VIEW COMPARISON:  CT from earlier in the same day. FINDINGS: Persistent small bowel dilatation is noted similar to that seen on the prior CT examination.  Contrast is noted within the bladder related to the prior CT. No acute bony abnormality is noted. No free air is seen. IMPRESSION: Persistent small-bowel obstruction Electronically Signed   By: Inez Catalina M.D.   On: 08/23/2019 12:46    Anti-infectives: Anti-infectives (From admission, onward)   None        Assessment/Plan HTN  Hx of 2 prior ventral hernia repairs - at least one with mesh, both done at the [place], most recent surgery was in 2015 and was laparoscopic SBO - CT 6/8 showed sbo with some thickened bowel loops, chronic gastritis - patient already eating solid food, reports feeling completely back to normal and having bowel function  - will place on regular diet since pt already tolerating this and notify primary team that he is stable for discharge from surgery perspective - no other surgical recommendations at this time  FEN: reg diet VTE: SCDs ID: no current abx   LOS: 1 day    Norm Parcel , [facility] 08/24/2019, 7:46 AM Please see Amion for pager number during day hours 7:00am-4:30pm

## 2019-10-18 ENCOUNTER — Ambulatory Visit: Payer: PRIVATE HEALTH INSURANCE | Admitting: Family Medicine

## 2019-10-18 DIAGNOSIS — Z0289 Encounter for other administrative examinations: Secondary | ICD-10-CM

## 2019-11-16 ENCOUNTER — Encounter (HOSPITAL_COMMUNITY): Payer: Self-pay | Admitting: Emergency Medicine

## 2019-11-16 ENCOUNTER — Other Ambulatory Visit: Payer: Self-pay

## 2019-11-16 ENCOUNTER — Telehealth (HOSPITAL_COMMUNITY): Payer: Self-pay

## 2019-11-16 ENCOUNTER — Telehealth (HOSPITAL_COMMUNITY): Payer: Self-pay | Admitting: *Deleted

## 2019-11-16 ENCOUNTER — Ambulatory Visit (HOSPITAL_COMMUNITY)
Admission: EM | Admit: 2019-11-16 | Discharge: 2019-11-16 | Disposition: A | Discharge status: Home / Self Care | Payer: Self-pay | Attending: Urgent Care | Admitting: Urgent Care

## 2019-11-16 DIAGNOSIS — R040 Epistaxis: Secondary | ICD-10-CM

## 2019-11-16 DIAGNOSIS — I10 Essential (primary) hypertension: Secondary | ICD-10-CM

## 2019-11-16 MED ORDER — HYDROCHLOROTHIAZIDE 12.5 MG PO TABS: 12.5000 mg | ORAL_TABLET | Freq: Every day | ORAL | 0 refills | Status: DC

## 2019-11-16 MED ORDER — HYDROCHLOROTHIAZIDE 12.5 MG PO TABS
12.5000 mg | ORAL_TABLET | Freq: Every day | ORAL | 0 refills | Status: DC
Start: 2019-11-16 — End: 2019-11-16

## 2019-11-16 NOTE — ED Triage Notes (Signed)
Pt reports BP has been high this week. He has had nosebleeds this week.  He has been out of BP meds for months due to move from [place]. He was taking HCTZ  He went to dentist today for cleaning and they refused him due to BP:: 140/96, 148/98, 150/100

## 2019-11-16 NOTE — Discharge Instructions (Addendum)
Week 1 start HCTZ (hydrochlorothiazide) at 12.5mg  once daily. Check your blood pressure every other day. The goal is to have it at 110-130 systolic (top number) and (70-80) diastolic. If it remains above these levels after 1 week, start taking 25mg  once daily (that's 2 tablets).    For diabetes or elevated blood sugar, please make sure you are avoiding starchy, carbohydrate foods like pasta, breads, pastry, rice, potatoes, desserts. These foods can elevated your blood sugar. Also, avoid sodas, sweet teas, sugary beverages, fruit juices.  Drinking plain water will be much more helpful, try 64 ounces of water daily.  It is okay to flavor your water naturally by cutting cucumber, lemon, mint or lime, placing it in a picture with water and drinking it over a period of 2 to 3 days as long as it remains refrigerated.    For elevated blood pressure, make sure you are monitoring salt in your diet.  Do not eat restaurant foods and limit processed foods at home, prepare/cook your own foods at home.  Processed foods include things like frozen meals preseasoned meats and dinners, deli meats, canned foods as they are high in sodium/salt.  Make sure your pain attention to sodium labels on foods you by at the grocery store.  For seasoning you can use a brand called Mrs. Dash which includes a lot of salt free seasonings.  Salads - kale, spinach, cabbage, spring mix; use seeds like pumpkin seeds or sunflower seeds, almonds, walnuts or pecans; you can also use 1-2 hard boiled eggs in your salads Fruits - avocadoes, berries (blueberries, raspberries, blackberries), apples, oranges, pomegranate, pear; avoid eating bananas, grapes regularly Vegetables - aspargus, cauliflower, broccoli, green beans, brussel spouts, bell peppers; stay away from starchy vegetables like potatoes, carrots, peas  Regarding meat it is better to eat lean meats and limit your red meat including pork to once a week.  Wild caught fish, chicken breast  are good options as they tend to be leaner sources of good protein.   DO NOT EAT ANY FOODS ON THIS LIST THAT YOU ARE ALLERGIC TO.

## 2019-11-16 NOTE — ED Provider Notes (Signed)
[facility]   MRN: [id number] DOB: 02-Jun-1965  Subjective:   Dylan Monforte is a 54 y.o. male presenting for check on blood pressure.  Patient states that he was getting dental work today and had multiple blood pressure readings that were elevated.  They decided to cancel his visit and recommend he come in for an office visit and clearance.  Patient is originally from [place], has been living here since 2018.  Has not establish care with a new PCP.  Was previously taking hydrochlorothiazide.  He is out of this medication.  Denies active headache, confusion, weakness, numbness or tingling, chest pain, shortness of breath, heart racing, nausea, vomiting, belly pain, hematuria, lower leg swelling.  No current facility-administered medications for this encounter. No current outpatient medications on file.   Allergies  Allergen Reactions  . Penicillins Anaphylaxis    Past Medical History:  Diagnosis Date  . Hypertension      Past Surgical History:  Procedure Laterality Date  . HERNIA REPAIR      Family History  Problem Relation Age of Onset  . Hypertension Mother     Social History   Tobacco Use  . Smoking status: Current Every Day Smoker    Types: Cigarettes  . Smokeless tobacco: Never Used  Vaping Use  . Vaping Use: Never used  Substance Use Topics  . Alcohol use: Not Currently  . Drug use: Not on file    ROS   Objective:   Vitals: BP (!) 157/103   Pulse 90   Temp 99.6 F (37.6 C) (Oral)   Resp 16   SpO2 94%   Physical Exam Constitutional:      General: He is not in acute distress.    Appearance: Normal appearance. He is well-developed. He is not ill-appearing, toxic-appearing or diaphoretic.  HENT:     Head: Normocephalic and atraumatic.     Right Ear: External ear normal.     Left Ear: External ear normal.     Nose: Nose normal.     Mouth/Throat:     Mouth: Mucous membranes are moist.     Pharynx: Oropharynx is clear.  Eyes:      General: No scleral icterus.       Right eye: No discharge.        Left eye: No discharge.     Extraocular Movements: Extraocular movements intact.     Conjunctiva/sclera: Conjunctivae normal.     Pupils: Pupils are equal, round, and reactive to light.  Cardiovascular:     Rate and Rhythm: Normal rate and regular rhythm.     Heart sounds: Normal heart sounds. No murmur heard.  No friction rub. No gallop.   Pulmonary:     Effort: Pulmonary effort is normal. No respiratory distress.     Breath sounds: Normal breath sounds. No stridor. No wheezing, rhonchi or rales.  Skin:    General: Skin is warm and dry.  Neurological:     Mental Status: He is alert and oriented to person, place, and time.     Cranial Nerves: No cranial nerve deficit.     Motor: No weakness.     Coordination: Coordination normal.     Gait: Gait normal.     Deep Tendon Reflexes: Reflexes normal.     Comments: Negative Romberg and pronator drift.  Psychiatric:        Mood and Affect: Mood normal.        Behavior: Behavior normal.  Thought Content: Thought content normal.        Judgment: Judgment normal.      Assessment and Plan :   PDMP not reviewed this encounter.  1. Essential hypertension   2. Elevated blood pressure reading in office with diagnosis of hypertension   3. Epistaxis     No signs of ACS, intracranial process.  Patient has uncontrolled hypertension, was agreeable to starting hydrochlorothiazide.  Will monitor blood pressure at home and try to meet goal of 110-130 systolic.Marland Kitchen  Dosing increase instructions provided to patient.  Recommended lifestyle modifications otherwise.  Completed his documentation to be able to have his dental work should he meet a blood pressure goal of less than 130/90.  Recommend he establish care with new PCP for further management of his hypertension, information given to him for [facility] internal medicine.  Counseled patient on potential for adverse effects with  medications prescribed/recommended today, ER and return-to-clinic precautions discussed, patient verbalized understanding.    Wallis Bamberg, [place] 11/16/19 [phone]

## 2020-03-21 ENCOUNTER — Ambulatory Visit
Admission: RE | Admit: 2020-03-21 | Discharge: 2020-03-21 | Disposition: A | Discharge status: Home / Self Care | Payer: Self-pay | Source: Ambulatory Visit | Attending: Nurse Practitioner | Admitting: Nurse Practitioner

## 2020-03-21 ENCOUNTER — Other Ambulatory Visit: Payer: Self-pay | Admitting: Nurse Practitioner

## 2020-03-21 DIAGNOSIS — M25551 Pain in right hip: Secondary | ICD-10-CM

## 2020-03-25 ENCOUNTER — Ambulatory Visit: Payer: Self-pay | Admitting: Podiatry

## 2020-04-02 ENCOUNTER — Ambulatory Visit (INDEPENDENT_AMBULATORY_CARE_PROVIDER_SITE_OTHER): Payer: Self-pay | Admitting: Podiatry

## 2020-04-02 ENCOUNTER — Other Ambulatory Visit: Payer: Self-pay

## 2020-04-02 DIAGNOSIS — Q828 Other specified congenital malformations of skin: Secondary | ICD-10-CM

## 2020-04-02 MED ORDER — KETOCONAZOLE 2 % EX CREA
1.0000 | TOPICAL_CREAM | Freq: Every day | CUTANEOUS | 2 refills | Status: DC
Start: 2020-04-02 — End: 2020-08-17

## 2020-04-02 NOTE — Patient Instructions (Signed)
Take dressing off in 8 hours and wash the foot with soap and water. If it is hurting or becomes uncomfortable before the 8 hours, go ahead and remove the bandage and wash the area.  If it blisters, apply antibiotic ointment and a band-aid.   Monitor for any signs/symptoms of infection. Call the office immediately if any occur or go directly to the emergency room. Call with any questions/concerns.   In 1 week start applying salicylic acid 10% ointment or cream. You should be able to find this over the counter or at the pharmacy

## 2020-04-02 NOTE — Progress Notes (Signed)
  Subjective:  Patient ID: Robert Howe, male    DOB: 02/20/1966,  MRN: [id number]  Chief Complaint  Patient presents with  . Plantar Warts    Left foot possible plantar wart. PT stated that it is very sore.    55 y.o. male presents with the above complaint. History confirmed with patient. Has been there for some time. He previously had it removed when he was 55 years old and has not had any issues since then until it recurred recently  Objective:  Physical Exam: warm, good capillary refill, no trophic changes or ulcerative lesions, normal DP and PT pulses and normal sensory exam. Right Foot: Porokeratosis submet 3  Assessment:   1. Porokeratosis      Plan:  Patient was evaluated and treated and all questions answered.   Discussed etiology and treatment options for porokeratosis. I recommend he treat at home daily with salicylic acid and/or urea cream. Today in the office I debrided it and excised the lesion with central core with a chisel blade and applied Cantharone to see if we can destroy the lesion. Offloading pad was applied. Post care instructions were given.  Return in about 6 weeks (around 05/14/2020), or if symptoms worsen or fail to improve.

## 2020-05-14 ENCOUNTER — Ambulatory Visit: Payer: Self-pay | Admitting: Podiatry

## 2020-05-16 ENCOUNTER — Ambulatory Visit: Payer: Self-pay | Admitting: Podiatry

## 2020-07-09 ENCOUNTER — Encounter: Payer: Self-pay | Admitting: Podiatry

## 2020-07-09 ENCOUNTER — Ambulatory Visit (INDEPENDENT_AMBULATORY_CARE_PROVIDER_SITE_OTHER): Payer: BC Managed Care – PPO | Admitting: Podiatry

## 2020-07-09 ENCOUNTER — Other Ambulatory Visit: Payer: Self-pay

## 2020-07-09 DIAGNOSIS — Z720 Tobacco use: Secondary | ICD-10-CM | POA: Insufficient documentation

## 2020-07-09 DIAGNOSIS — M1610 Unilateral primary osteoarthritis, unspecified hip: Secondary | ICD-10-CM | POA: Insufficient documentation

## 2020-07-09 DIAGNOSIS — M169 Osteoarthritis of hip, unspecified: Secondary | ICD-10-CM | POA: Insufficient documentation

## 2020-07-09 DIAGNOSIS — Q828 Other specified congenital malformations of skin: Secondary | ICD-10-CM

## 2020-07-09 DIAGNOSIS — L723 Sebaceous cyst: Secondary | ICD-10-CM | POA: Insufficient documentation

## 2020-07-09 DIAGNOSIS — K432 Incisional hernia without obstruction or gangrene: Secondary | ICD-10-CM | POA: Insufficient documentation

## 2020-07-09 NOTE — Progress Notes (Signed)
  Subjective:  Patient ID: Robert Howe, male    DOB: 07/25/1965,  MRN: [id number]  Chief Complaint  Patient presents with  . Plantar Warts     f/u plantar warts RT FT    55 y.o. male returns for follow-up with the above complaint. History confirmed with patient.  Still very painful for him.  He would like it removed  Objective:  Physical Exam: warm, good capillary refill, no trophic changes or ulcerative lesions, normal DP and PT pulses and normal sensory exam. Left foot : Porokeratosis submet 3, hallux and submit 1  Assessment:   1. Porokeratosis      Plan:  Patient was evaluated and treated and all questions answered.   Has not improved and he would like surgical excision today.  Discussed with him the risks and benefits of this including recurrence or infection.  Following local administration of 3 cc of 1% lidocaine plain with epinephrine 1:200,000, I curettage the lesion with a 2 curette to subcutaneous tissue to excise lesion completely.  Triple antibiotic ointment and salinocaine ointment was applied with a pad.  He will change the dressing with antibiotic ointment for the next 5 days.  Return in about 6 weeks (around 08/20/2020).

## 2020-08-17 ENCOUNTER — Other Ambulatory Visit: Payer: Self-pay | Admitting: Podiatry

## 2020-08-20 ENCOUNTER — Ambulatory Visit: Payer: BC Managed Care – PPO | Admitting: Podiatry

## 2020-09-12 ENCOUNTER — Emergency Department (HOSPITAL_COMMUNITY): Payer: BC Managed Care – PPO

## 2020-09-12 ENCOUNTER — Encounter (HOSPITAL_COMMUNITY): Payer: Self-pay | Admitting: Emergency Medicine

## 2020-09-12 ENCOUNTER — Emergency Department (HOSPITAL_COMMUNITY)
Admission: EM | Admit: 2020-09-12 | Discharge: 2020-09-12 | Disposition: A | Discharge status: Home / Self Care | Payer: BC Managed Care – PPO | Attending: Emergency Medicine | Admitting: Emergency Medicine

## 2020-09-12 DIAGNOSIS — K5651 Intestinal adhesions [bands], with partial obstruction: Secondary | ICD-10-CM | POA: Insufficient documentation

## 2020-09-12 DIAGNOSIS — Z79899 Other long term (current) drug therapy: Secondary | ICD-10-CM | POA: Diagnosis not present

## 2020-09-12 DIAGNOSIS — F17210 Nicotine dependence, cigarettes, uncomplicated: Secondary | ICD-10-CM | POA: Diagnosis not present

## 2020-09-12 DIAGNOSIS — I10 Essential (primary) hypertension: Secondary | ICD-10-CM | POA: Diagnosis not present

## 2020-09-12 DIAGNOSIS — Z20822 Contact with and (suspected) exposure to covid-19: Secondary | ICD-10-CM | POA: Diagnosis not present

## 2020-09-12 DIAGNOSIS — R1084 Generalized abdominal pain: Secondary | ICD-10-CM | POA: Diagnosis present

## 2020-09-12 LAB — CBC WITH DIFFERENTIAL/PLATELET
Abs Immature Granulocytes: 0.01 10*3/uL (ref 0.00–0.07)
Basophils Absolute: 0 10*3/uL (ref 0.0–0.1)
Basophils Relative: 0 %
Eosinophils Absolute: 0 10*3/uL (ref 0.0–0.5)
Eosinophils Relative: 0 %
HCT: 51.4 % (ref 39.0–52.0)
Hemoglobin: 17.1 g/dL — ABNORMAL HIGH (ref 13.0–17.0)
Immature Granulocytes: 0 %
Lymphocytes Relative: 21 %
Lymphs Abs: 1.1 10*3/uL (ref 0.7–4.0)
MCH: 33.2 pg (ref 26.0–34.0)
MCHC: 33.3 g/dL (ref 30.0–36.0)
MCV: 99.8 fL (ref 80.0–100.0)
Monocytes Absolute: 0.6 10*3/uL (ref 0.1–1.0)
Monocytes Relative: 10 %
Neutro Abs: 3.7 10*3/uL (ref 1.7–7.7)
Neutrophils Relative %: 69 %
Platelets: 236 10*3/uL (ref 150–400)
RBC: 5.15 MIL/uL (ref 4.22–5.81)
RDW: 12.4 % (ref 11.5–15.5)
WBC: 5.4 10*3/uL (ref 4.0–10.5)
nRBC: 0 % (ref 0.0–0.2)

## 2020-09-12 LAB — RESP PANEL BY RT-PCR (FLU A&B, COVID) ARPGX2
Influenza A by PCR: NEGATIVE
Influenza B by PCR: NEGATIVE
SARS Coronavirus 2 by RT PCR: NEGATIVE

## 2020-09-12 LAB — COMPREHENSIVE METABOLIC PANEL
ALT: 190 U/L — ABNORMAL HIGH (ref 0–44)
AST: 45 U/L — ABNORMAL HIGH (ref 15–41)
Albumin: 4 g/dL (ref 3.5–5.0)
Alkaline Phosphatase: 59 U/L (ref 38–126)
Anion gap: 13 (ref 5–15)
BUN: 14 mg/dL (ref 6–20)
CO2: 24 mmol/L (ref 22–32)
Calcium: 9.7 mg/dL (ref 8.9–10.3)
Chloride: 102 mmol/L (ref 98–111)
Creatinine, Ser: 0.79 mg/dL (ref 0.61–1.24)
GFR, Estimated: >60 mL/min (ref >60)
Glucose, Bld: 154 mg/dL — ABNORMAL HIGH (ref 70–99)
Potassium: 3.9 mmol/L (ref 3.5–5.1)
Sodium: 139 mmol/L (ref 135–145)
Total Bilirubin: 2.2 mg/dL — ABNORMAL HIGH (ref 0.3–1.2)
Total Protein: 8.7 g/dL — ABNORMAL HIGH (ref 6.5–8.1)

## 2020-09-12 MED ORDER — MORPHINE SULFATE (PF) 4 MG/ML IV SOLN
4.0000 mg | Freq: Once | INTRAVENOUS | Status: AC
  Administered 2020-09-12: 4 mg via INTRAVENOUS
  Filled 2020-09-12: qty 1

## 2020-09-12 MED ORDER — ONDANSETRON 4 MG PO TBDP
4.0000 mg | ORAL_TABLET | Freq: Once | ORAL | Status: AC
  Administered 2020-09-12: 4 mg via ORAL
  Filled 2020-09-12: qty 1

## 2020-09-12 MED ORDER — OXYCODONE-ACETAMINOPHEN 5-325 MG PO TABS
1.0000 | ORAL_TABLET | Freq: Once | ORAL | Status: AC
  Administered 2020-09-12: 1 via ORAL
  Filled 2020-09-12: qty 1

## 2020-09-12 MED ORDER — IOHEXOL 300 MG/ML  SOLN
100.0000 mL | Freq: Once | INTRAMUSCULAR | Status: AC | PRN
  Administered 2020-09-12: 100 mL via INTRAVENOUS

## 2020-09-12 NOTE — ED Triage Notes (Signed)
Patient BIB [facility] for complaint of abdominal pain and distention that started yesterday, history of multiple hernia surgeries, last repair in 2015. Patient denies other complaints. Patient alert, oriented, and in no apparent distress at this time.

## 2020-09-12 NOTE — ED Notes (Signed)
Could not obtain vitals. Pt being transported to CT

## 2020-09-12 NOTE — ED Notes (Signed)
Patient transported to CT 

## 2020-09-12 NOTE — ED Provider Notes (Signed)
Emergency Medicine Provider Triage Evaluation Note  Robert Howe , a 55 y.o. male  was evaluated in triage.  Pt complains of abdominal pain and distension.  Patient surgical history notable for bilateral inguinal hernia repair with mesh.  History of SBO.  1 day history of severe abdominal pain and swelling.  He is having nausea and emesis.  He is unable to pass gas or have bowel movement.  Review of Systems  Positive: Abdominal pain, distension Negative: Fevers, chills, hematemesis  Physical Exam  BP (!) 146/108 (BP Location: Right Arm)   Pulse 92   Temp 99.1 F (37.3 C) (Oral)   Resp 20   SpO2 94%  Gen:   Awake, no distress   Resp:  Normal effort  MSK:   Moves extremities without difficulty  Other:  Abdomen is distended, tender.  Medical Decision Making  Medically screening exam initiated at 9:44 AM.  Appropriate orders placed.  Robert Howe was informed that the remainder of the evaluation will be completed by another provider, this initial triage assessment does not replace that evaluation, and the importance of remaining in the ED until their evaluation is complete.  Suspect SBO.  Will treat with pain medications.  CT imaging ordered.  Likely require NG tube placement and surgical consult.   Lorelee New, PA-C 09/12/20 0945    Linwood Dibbles, MD 09/13/20 [phone]

## 2020-09-12 NOTE — ED Provider Notes (Signed)
[facility] EMERGENCY DEPARTMENT Provider Note   CSN: [id number] Arrival date & time: 09/12/20  0093     History Chief Complaint  Patient presents with   Abdominal Pain    Robert Howe is a 55 y.o. male with past medical history significant for laparoscopic abdominal hernia repair with mesh placement and small bowel obstruction 08/2019 who presents the ED with a 1 day history of severe abdominal pain and distention.  He states that his abdomen is grossly swollen compared to baseline.  He reports that he has not been passing gas or have been able to have a bowel movement.  He reports that he has been having associated nausea and emesis.  Denies any hematemesis, fevers or chills, chest pain/difficulty breathing, or urinary symptoms.  HPI     Past Medical History:  Diagnosis Date   Hypertension     Patient Active Problem List   Diagnosis Date Noted   Incisional hernia 07/09/2020   Osteoarthrosis, pelvic region and thigh 07/09/2020   Sebaceous cyst 07/09/2020   Tobacco user 07/09/2020   SBO (small bowel obstruction) (HCC) 08/23/2019    Past Surgical History:  Procedure Laterality Date   HERNIA REPAIR         Family History  Problem Relation Age of Onset   Hypertension Mother     Social History   Tobacco Use   Smoking status: Every Day    Pack years: 0.00    Types: Cigarettes   Smokeless tobacco: Never  Vaping Use   Vaping Use: Never used  Substance Use Topics   Alcohol use: Not Currently    Home Medications Prior to Admission medications   Medication Sig Start Date End Date Taking? Authorizing Provider  amLODipine (NORVASC) 5 MG tablet Take 5 mg by mouth at bedtime. 03/20/20   [provider]  hydrochlorothiazide (HYDRODIURIL) 12.5 MG tablet Take 1 tablet (12.5 mg total) by mouth daily. 11/16/19   Wallis Bamberg, PA-C  ketoconazole (NIZORAL) 2 % cream APPLY 1 APPLICATION TOPICALLY DAILY 08/17/20   McDonald, Rachelle Hora, DPM  nicotine  (NICODERM CQ - DOSED IN MG/24 HOURS) 14 mg/24hr patch 14 mg daily. 01/29/20   [provider]    Allergies    Penicillins and Ibuprofen  Review of Systems   Review of Systems  All other systems reviewed and are negative.  Physical Exam Updated Vital Signs BP 128/88   Pulse 89   Temp 99.1 F (37.3 C) (Oral)   Resp 20   SpO2 92%   Physical Exam Vitals and nursing note reviewed. Exam conducted with a chaperone present.  Constitutional:      General: He is not in acute distress.    Appearance: He is ill-appearing. He is not toxic-appearing.  HENT:     Head: Normocephalic and atraumatic.  Eyes:     General: No scleral icterus.    Conjunctiva/sclera: Conjunctivae normal.  Cardiovascular:     Rate and Rhythm: Normal rate.     Pulses: Normal pulses.  Pulmonary:     Effort: Pulmonary effort is normal. No respiratory distress.  Abdominal:     General: There is distension.     Tenderness: There is abdominal tenderness.     Comments: Distended abdomen, generalized tenderness, most notably over left side.  Musculoskeletal:     Right lower leg: No edema.     Left lower leg: No edema.  Skin:    General: Skin is dry.  Neurological:     Mental Status:  He is alert and oriented to person, place, and time.     GCS: GCS eye subscore is 4. GCS verbal subscore is 5. GCS motor subscore is 6.  Psychiatric:        Mood and Affect: Mood normal.        Behavior: Behavior normal.        Thought Content: Thought content normal.    ED Results / Procedures / Treatments   Labs (all labs ordered are listed, but only abnormal results are displayed) Labs Reviewed  CBC WITH DIFFERENTIAL/PLATELET - Abnormal; Notable for the following components:      Result Value   Hemoglobin 17.1 (*)    All other components within normal limits  COMPREHENSIVE METABOLIC PANEL - Abnormal; Notable for the following components:   Glucose, Bld 154 (*)    Total Protein 8.7 (*)    AST 45 (*)    ALT 190  (*)    Total Bilirubin 2.2 (*)    All other components within normal limits  RESP PANEL BY RT-PCR (FLU A&B, COVID) ARPGX2  URINALYSIS, ROUTINE W REFLEX MICROSCOPIC    EKG None  Radiology CT ABDOMEN PELVIS W CONTRAST  Result Date: 09/12/2020 CLINICAL DATA:  Abdominal distension EXAM: CT ABDOMEN AND PELVIS WITH CONTRAST TECHNIQUE: Multidetector CT imaging of the abdomen and pelvis was performed using the standard protocol following bolus administration of intravenous contrast. CONTRAST:  OMNIPAQUE IOHEXOL 300 MG/ML  SOLN COMPARISON:  08/23/2019 FINDINGS: Lower chest: Stable fat containing Bochdalek's hernia on the right. Hepatobiliary: Fatty infiltration of the liver is noted. The gallbladder is unremarkable. Pancreas: Unremarkable. No pancreatic ductal dilatation or surrounding inflammatory changes. Spleen: Normal in size without focal abnormality. Adrenals/Urinary Tract: Adrenal glands are within normal limits. Kidneys demonstrate cystic change bilaterally with normal excretion of contrast material. No renal calculi are seen. No obstructive changes are noted. The bladder is decompressed. Stomach/Bowel: Appendix is within normal limits. The colon is predominately decompressed. Multifocal areas of small bowel dilatation with some fecalization of bowel contents is noted. The loops appears somewhat matted to the anterior abdominal wall likely related to adhesions from prior hernia repair. The more distal small bowel is within normal limits. Vascular/Lymphatic: Aortic atherosclerosis. No enlarged abdominal or pelvic lymph nodes. Reproductive: Prostate is unremarkable. Other: No abdominal wall hernia or abnormality. No abdominopelvic ascites. Musculoskeletal: Degenerative changes of the hip joints are noted. No acute bony abnormality is seen. IMPRESSION: Changes consistent with partial small bowel obstruction with dilatation of distal jejunal and proximal ileal loops related to adhesions along the  anterior abdominal wall from prior hernia repair. No recurrent herniation is noted. Chronic changes as described above. Electronically Signed   By: Alcide Clever M.D.   On: 09/12/2020 13:54    Procedures Procedures   Medications Ordered in ED Medications  oxyCODONE-acetaminophen (PERCOCET/ROXICET) 5-325 MG per tablet 1 tablet (1 tablet Oral Given 09/12/20 0945)  ondansetron (ZOFRAN-ODT) disintegrating tablet 4 mg (4 mg Oral Given 09/12/20 0945)  morphine 4 MG/ML injection 4 mg (4 mg Intravenous Given 09/12/20 1225)  iohexol (OMNIPAQUE) 300 MG/ML solution 100 mL (100 mLs Intravenous Contrast Given 09/12/20 1324)    ED Course  I have reviewed the triage vital signs and the nursing notes.  Pertinent labs & imaging results that were available during my care of the patient were reviewed by me and considered in my medical decision making (see chart for details).  Clinical Course as of 09/12/20 1443  Thu Sep 12, 2020  1439 I  spoke with Johnny Bridge, [place] with [facility].  She also recommends observation admission.  However, if he is adamant about going home she recommends liquid diet and outpatient follow-up with [facility].  They potentially discuss lysis of his known adhesions causing recurrent SBO. [GG]    Clinical Course User Index [GG] Lorelee New, PA-C   MDM Rules/Calculators/A&P                          Robert Howe was evaluated in Emergency Department on 09/12/2020 for the symptoms described in the history of present illness. He was evaluated in the context of the global COVID-19 pandemic, which necessitated consideration that the patient might be at risk for infection with the SARS-CoV-2 virus that causes COVID-19. Institutional protocols and algorithms that pertain to the evaluation of patients at risk for COVID-19 are in a state of rapid change based on information released by regulatory bodies including the CDC and federal and state organizations. These policies and  algorithms were followed during the patient's care in the ED.  I personally reviewed patient's medical chart and all notes from triage and staff during today's encounter. I have also ordered and reviewed all labs and imaging that I felt to be medically necessary in the evaluation of this patient's complaints and with consideration of their physical exam. If needed, translation services were available and utilized.   Patient was given Percocet and Zofran in triage.  Nausea symptoms relatively well controlled on my evaluation, still endorsing significant pain symptoms.  We will treat with morphine 4 mg IV.  Labs are reviewed and unremarkable aside from mild transaminitis, new when compared to prior labs 1 year ago.  CT is personally reviewed and demonstrates changes consistent with partial small bowel obstruction with dilatation of the distal jejunal and proximal ileal loops relating to adhesions along anterior abdominal wall from prior hernia repair.  No recurrent herniation.    On subsequent evaluation, patient states that his pain symptoms have almost entirely improved.  He has not had any episodes of nausea.  He did have a very small bowel movement.  He has had these episodes in the past and feels comfortable going home right now.  I asked that he wait until I speak with general surgery for their recommendations.  He understands that he would need to follow a liquid diet and that he would need to return to the ED if you develop any new or worsening symptoms.  I recommended observation admission, but he and his wife at bedside would prefer to go home to manage symptoms.  I spoke with Johnny Bridge, [place] with [facility].  She also recommends observation admission.  However, if he is adamant about going home she recommends liquid diet and outpatient follow-up with [facility].  They potentially discuss lysis of his known adhesions causing recurrent SBO.  Patient is aware and understanding of  the return precautions.  He is much more comfortable and feeling improved.  He will follow-up with [facility] for ongoing evaluation and management.  He recently moved down here from [place] which is why he has not yet established care with general surgery team.  Final Clinical Impression(s) / ED Diagnoses Final diagnoses:  Intestinal adhesions with partial obstruction [facility])    Rx / DC Orders ED Discharge Orders     None        Lorelee New, PA-C 09/12/20 1443    Linwood Dibbles, MD  09/13/20 0915  

## 2020-09-12 NOTE — Discharge Instructions (Addendum)
You have been diagnosed with a partial small bowel obstruction.  I am glad that your symptoms have improved during your duration here in the ED.  I recommend a liquid diet exclusively until symptoms improve.  Please call the office of [facility] to schedule appointment for ongoing evaluation and management.  Return to the ER seek immediate medical attention should you experience any new or worsening symptoms.

## 2020-12-02 ENCOUNTER — Other Ambulatory Visit: Payer: Self-pay | Admitting: Podiatry

## 2021-01-22 ENCOUNTER — Other Ambulatory Visit (HOSPITAL_COMMUNITY)
Admission: RE | Admit: 2021-01-22 | Discharge: 2021-01-22 | Disposition: A | Discharge status: Home / Self Care | Payer: BC Managed Care – PPO | Source: Ambulatory Visit | Attending: Family Medicine | Admitting: Family Medicine

## 2021-01-22 ENCOUNTER — Other Ambulatory Visit: Payer: Self-pay | Admitting: Family Medicine

## 2021-01-22 DIAGNOSIS — Z113 Encounter for screening for infections with a predominantly sexual mode of transmission: Secondary | ICD-10-CM | POA: Insufficient documentation

## 2021-01-24 LAB — MOLECULAR ANCILLARY ONLY
Bacterial Vaginitis (gardnerella): NEGATIVE
Candida Glabrata: NEGATIVE
Candida Vaginitis: NEGATIVE
Chlamydia: NEGATIVE
Comment: NEGATIVE
Comment: NEGATIVE
Comment: NEGATIVE
Comment: NEGATIVE
Comment: NEGATIVE
Comment: NORMAL
Neisseria Gonorrhea: NEGATIVE
Trichomonas: POSITIVE — AB

## 2021-08-16 ENCOUNTER — Emergency Department (HOSPITAL_COMMUNITY): Payer: BC Managed Care – PPO

## 2021-08-16 ENCOUNTER — Emergency Department (HOSPITAL_COMMUNITY)
Admission: EM | Admit: 2021-08-16 | Discharge: 2021-08-17 | Discharge status: Against Medical Advice | Payer: BC Managed Care – PPO | Attending: Emergency Medicine | Admitting: Emergency Medicine

## 2021-08-16 ENCOUNTER — Other Ambulatory Visit: Payer: Self-pay

## 2021-08-16 ENCOUNTER — Encounter (HOSPITAL_COMMUNITY): Payer: Self-pay | Admitting: Emergency Medicine

## 2021-08-16 DIAGNOSIS — K56609 Unspecified intestinal obstruction, unspecified as to partial versus complete obstruction: Secondary | ICD-10-CM | POA: Insufficient documentation

## 2021-08-16 DIAGNOSIS — R140 Abdominal distension (gaseous): Secondary | ICD-10-CM | POA: Diagnosis present

## 2021-08-16 LAB — CBC WITH DIFFERENTIAL/PLATELET
Abs Immature Granulocytes: 0.02 10*3/uL (ref 0.00–0.07)
Basophils Absolute: 0 10*3/uL (ref 0.0–0.1)
Basophils Relative: 0 %
Eosinophils Absolute: 0.1 10*3/uL (ref 0.0–0.5)
Eosinophils Relative: 1 %
HCT: 49.5 % (ref 39.0–52.0)
Hemoglobin: 16.9 g/dL (ref 13.0–17.0)
Immature Granulocytes: 0 %
Lymphocytes Relative: 37 %
Lymphs Abs: 1.8 10*3/uL (ref 0.7–4.0)
MCH: 34.2 pg — ABNORMAL HIGH (ref 26.0–34.0)
MCHC: 34.1 g/dL (ref 30.0–36.0)
MCV: 100.2 fL — ABNORMAL HIGH (ref 80.0–100.0)
Monocytes Absolute: 0.5 10*3/uL (ref 0.1–1.0)
Monocytes Relative: 10 %
Neutro Abs: 2.6 10*3/uL (ref 1.7–7.7)
Neutrophils Relative %: 52 %
Platelets: 214 10*3/uL (ref 150–400)
RBC: 4.94 MIL/uL (ref 4.22–5.81)
RDW: 13.3 % (ref 11.5–15.5)
WBC: 4.9 10*3/uL (ref 4.0–10.5)
nRBC: 0 % (ref 0.0–0.2)

## 2021-08-16 LAB — COMPREHENSIVE METABOLIC PANEL
ALT: 108 U/L — ABNORMAL HIGH (ref 0–44)
AST: 58 U/L — ABNORMAL HIGH (ref 15–41)
Albumin: 4 g/dL (ref 3.5–5.0)
Alkaline Phosphatase: 62 U/L (ref 38–126)
Anion gap: 15 (ref 5–15)
BUN: 6 mg/dL (ref 6–20)
CO2: 22 mmol/L (ref 22–32)
Calcium: 8.7 mg/dL — ABNORMAL LOW (ref 8.9–10.3)
Chloride: 99 mmol/L (ref 98–111)
Creatinine, Ser: 0.84 mg/dL (ref 0.61–1.24)
GFR, Estimated: >60 mL/min (ref >60)
Glucose, Bld: 114 mg/dL — ABNORMAL HIGH (ref 70–99)
Potassium: 3.7 mmol/L (ref 3.5–5.1)
Sodium: 136 mmol/L (ref 135–145)
Total Bilirubin: 1.6 mg/dL — ABNORMAL HIGH (ref 0.3–1.2)
Total Protein: 8.7 g/dL — ABNORMAL HIGH (ref 6.5–8.1)

## 2021-08-16 LAB — URINALYSIS, ROUTINE W REFLEX MICROSCOPIC
Bacteria, UA: NONE SEEN
Bilirubin Urine: NEGATIVE
Glucose, UA: NEGATIVE mg/dL
Ketones, ur: NEGATIVE mg/dL
Leukocytes,Ua: NEGATIVE
Nitrite: NEGATIVE
Protein, ur: 100 mg/dL — AB
Specific Gravity, Urine: 1.017 (ref 1.005–1.030)
pH: 5 (ref 5.0–8.0)

## 2021-08-16 LAB — LACTIC ACID, PLASMA: Lactic Acid, Venous: 4.2 mmol/L (ref 0.5–1.9)

## 2021-08-16 LAB — LIPASE, BLOOD: Lipase: 32 U/L (ref 11–51)

## 2021-08-16 MED ORDER — IOHEXOL 300 MG/ML  SOLN
100.0000 mL | Freq: Once | INTRAMUSCULAR | Status: AC | PRN
  Administered 2021-08-16: 100 mL via INTRAVENOUS

## 2021-08-16 MED ORDER — HYDROMORPHONE HCL 1 MG/ML IJ SOLN
1.0000 mg | Freq: Once | INTRAMUSCULAR | Status: AC
  Administered 2021-08-16: 1 mg via INTRAVENOUS
  Filled 2021-08-16: qty 1

## 2021-08-16 MED ORDER — LACTATED RINGERS IV BOLUS
1000.0000 mL | Freq: Once | INTRAVENOUS | Status: AC
  Administered 2021-08-16: 1000 mL via INTRAVENOUS

## 2021-08-16 NOTE — ED Notes (Signed)
Patient Spo2 dropped 86% on room, placed on O2 [place], Spo2 increased to 92%

## 2021-08-16 NOTE — Discharge Instructions (Signed)
You are leaving AGAINST MEDICAL ADVICE.  If at any point you develop new or worsening symptoms, especially abdominal pain, vomiting, or any other new/concerning symptoms then return to the ER or call 911.

## 2021-08-16 NOTE — ED Triage Notes (Signed)
Pt presents with diffuse severe abdominal pain with vomiting, no diarrhea since this morning.  Pt states his abdomen is distended.  Hx of multiple hernia surgeries in the past.  No fever, chills, or shob.

## 2021-08-16 NOTE — Consult Note (Signed)
Surgical Evaluation Requesting provider: Dr. Regenia Skeeter   Chief Complaint: abdominal pain  HPI: 56yo male presented with diffuse severe abdominal pain with emesis and distention. This began around 4pm and progressively worsened.   States he has not had any flatus today.  He has a history of similar previous episodes while living in [place]; and it sounds like he frequently had to go to the emergency room where he had received pain medication, his symptoms would resolve, and he would be discharged. He reports a history of an inguinal hernia surgery, and then a umbilical hernia surgery followed by laparoscopic repair of recurrent umbilical hernia with mesh.  His obstructive issues began after the latter. He underwent on presentation with the severity of his pain and elevated lactate, CT scan was performed as documented below concerning for high-grade small bowel obstruction.  Surgery was consulted for further evaluation.  At this time, the patient denies any ongoing abdominal pain and reports that his abdominal girth is at its normal baseline.  No nausea.  The patient is not willing to have an NG tube or be admitted to the hospital at this time.  Allergies  Allergen Reactions   Penicillins Anaphylaxis and Hives   Ibuprofen Other (See Comments)    Past Medical History:  Diagnosis Date   Hypertension     Past Surgical History:  Procedure Laterality Date   HERNIA REPAIR      Family History  Problem Relation Age of Onset   Hypertension Mother     Social History   Socioeconomic History   Marital status: Single    Spouse name: Not on file   Number of children: Not on file   Years of education: Not on file   Highest education level: Not on file  Occupational History   Not on file  Tobacco Use   Smoking status: Every Day    Types: Cigarettes   Smokeless tobacco: Never  Vaping Use   Vaping Use: Never used  Substance and Sexual Activity   Alcohol use: Not Currently   Drug use: Not  on file   Sexual activity: Not on file  Other Topics Concern   Not on file  Social History Narrative   Not on file   Social Determinants of Health   Financial Resource Strain: Not on file  Food Insecurity: Not on file  Transportation Needs: Not on file  Physical Activity: Not on file  Stress: Not on file  Social Connections: Not on file    No current facility-administered medications on file prior to encounter.   Current Outpatient Medications on File Prior to Encounter  Medication Sig Dispense Refill   amLODipine (NORVASC) 5 MG tablet Take 5 mg by mouth at bedtime.     hydrochlorothiazide (HYDRODIURIL) 12.5 MG tablet Take 1 tablet (12.5 mg total) by mouth daily. 90 tablet 0   ketoconazole (NIZORAL) 2 % cream APPLY 1 APPLICATION TOPICALLY DAILY 60 g 2   nicotine (NICODERM CQ - DOSED IN MG/24 HOURS) 14 mg/24hr patch 14 mg daily.      Review of Systems: a complete, 10pt review of systems was completed with pertinent positives and negatives as documented in the HPI  Physical Exam: Vitals:   08/16/21 2245 08/16/21 2300  BP: (!) 187/102 (!) 172/104  Pulse: 80 83  Resp: 20 (!) 22  Temp:    SpO2: 96% 95%   Gen: A&Ox3, no distress  Chest: respiratory effort is normal.  Cardiovascular: RRR with palpable distal pulses Gastrointestinal: soft,  mildly distended, completely nontender. No mass, hepatomegaly or splenomegaly. No hernia. Lymphatic: no lymphadenopathy in the neck or groin Muscoloskeletal: no clubbing or cyanosis of the fingers.  Strength is symmetrical throughout.  Range of motion of bilateral upper and lower extremities normal without pain, crepitation or contracture. Neuro: cranial nerves grossly intact.  Sensation intact to light touch diffusely. Psych: appropriate mood and affect, normal insight/judgment intact  Skin: warm and dry      Latest Ref Rng & Units 08/16/2021    8:37 PM 09/12/2020    9:41 AM 08/23/2019    7:36 AM  CBC  WBC 4.0 - 10.5 K/uL 4.9   5.4   4.4     Hemoglobin 13.0 - 17.0 g/dL 16.9   17.1   16.7    Hematocrit 39.0 - 52.0 % 49.5   51.4   50.0    Platelets 150 - 400 K/uL 214   236   210         Latest Ref Rng & Units 08/16/2021    8:37 PM 09/12/2020    9:41 AM 08/23/2019    7:36 AM  CMP  Glucose 70 - 99 mg/dL 114   154   123    BUN 6 - 20 mg/dL 6   14   19     Creatinine 0.61 - 1.24 mg/dL 0.84   0.79   0.78    Sodium 135 - 145 mmol/L 136   139   135    Potassium 3.5 - 5.1 mmol/L 3.7   3.9   4.3    Chloride 98 - 111 mmol/L 99   102   101    CO2 22 - 32 mmol/L 22   24   25     Calcium 8.9 - 10.3 mg/dL 8.7   9.7   9.1    Total Protein 6.5 - 8.1 g/dL 8.7   8.7   8.5    Total Bilirubin 0.3 - 1.2 mg/dL 1.6   2.2   1.7    Alkaline Phos 38 - 126 U/L 62   59   40    AST 15 - 41 U/L 58   45   13    ALT 0 - 44 U/L 108   190   32      No results found for: INR, PROTIME  Imaging: CT ABDOMEN PELVIS W CONTRAST  Result Date: 08/16/2021 CLINICAL DATA:  Nausea, vomiting and abdominal pain with suspected bowel obstruction. EXAM: CT ABDOMEN AND PELVIS WITH CONTRAST TECHNIQUE: Multidetector CT imaging of the abdomen and pelvis was performed using the standard protocol following bolus administration of intravenous contrast. RADIATION DOSE REDUCTION: This exam was performed according to the departmental dose-optimization program which includes automated exposure control, adjustment of the mA and/or kV according to patient size and/or use of iterative reconstruction technique. CONTRAST:  176mL OMNIPAQUE IOHEXOL 300 MG/ML  SOLN COMPARISON:  CTs with IV contrast 09/12/2020 and 08/23/2019. 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 There is posterior atelectasis both lower lobes without lung base infiltrates. Small hiatal hernia. Mild stable cardiomegaly. No pericardial effusion. Hepatobiliary: 19 cm in length moderately steatotic liver, similar appearance. No mass enhancement. Unremarkable  gallbladder common bile ducts. Pancreas: No focal abnormality or ductal dilatation. Spleen: No focal abnormality or splenomegaly. Adrenals/Urinary Tract: There is no adrenal mass. There are small scattered renal cysts and scattered too small to characterize bilateral cortical hypodensities which are probably also cysts  and unchanged. No new abnormality. No urinary stone or obstruction. Unremarkable bladder for the degree of distention. Stomach/Bowel: Stomach is fluid distended with backed up fluid in the duodenum and stomach. Prior broad-based ventral hernia repair is noted with multiple small bowel loops adherent to the undersurface of the mesh repair. Bowel loops above this level are dilated up to 4.5 cm, below this level and into the pelvis and terminal ileum the small bowel is collapsed. The most dilated segments contain fecal matter. There are several angulated possible transitional small bowel segments anteriorly along the mesh repair. No single definitive transitional segment could be identified. The appendix is normal. The large intestine wall is normal in thickness. There is mild fecal stasis ascending and transverse colon. Sigmoid diverticula without diverticulitis. Vascular/Lymphatic: No significant vascular findings are present. No enlarged abdominal or pelvic lymph nodes. Reproductive: Enlarged prostate, measures 4.7 cm, unchanged. Other: Intact ventral mesh hernia repair. No incarcerated hernia. Seen previously there is trace fluid in the posterior deep pelvis to the left of the rectum. There are mild mesenteric congestive features at the level of the adherent small bowel loops along the mesh. There is no free air, hemorrhage, pneumatosis or abscess. Musculoskeletal: Again noted is advanced right and moderate left hip DJD, ankylosis across the left SI joint, mild degenerative change lumbar spine. No concerning regional bone lesion. IMPRESSION: 1. High-grade, approximately mid small-bowel obstruction  appears to be at the level of the old ventral mesh hernia repair, where there are multiple adherent small bowel segments. There are several possible transitional segments deep to the mesh repair with the most dilated small bowel containing fecal material. 2. Mesenteric congestive features are noted at this level but no pneumatosis is seen. No recurrent hernia is evident. 3. Fatty liver. 4. Aortic atherosclerosis. 5. Renal cysts. 6. Prostatomegaly. 7. Asymmetric right-greater-than-left hip DJD. Electronically Signed   By: Telford Nab M.D.   On: 08/16/2021 22:40     A/P: The patient looks much better than his CT scan would suggest, with a completely benign abdominal exam at this time.  I discussed with him that my recommendation would be for NG tube decompression and admission for serial abdominal exams and small bowel obstruction protocol.  He understands that if he leaves the hospital he may develop recurrent symptoms, bowel ischemia, and the need for emergent surgical intervention.  He understands the rationale for the recommended treatment, but at this time declines NG tube placement or hospital admission.    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

## 2021-08-16 NOTE — ED Notes (Signed)
Patient transported to CT 

## 2021-08-16 NOTE — ED Provider Notes (Signed)
[facility] Provider Note   CSN: [id number] Arrival date & time: 08/16/21  2014     History  Chief Complaint  Patient presents with   Abdominal Pain    Robert Howe is a 56 y.o. male.  HPI 56 year old male presents with abdominal pain and distention.  Started around 4 PM.  Has progressively worsened.  He had vomiting and severe diffuse pain.  He is not passing gas.  Pain is severe.  Similar to prior bowel obstructions from hernias.  [place]  Home Medications Prior to Admission medications   Medication Sig Start Date End Date Taking? Authorizing Provider  amLODipine (NORVASC) 5 MG tablet Take 5 mg by mouth at bedtime. 03/20/20   [provider]  hydrochlorothiazide (HYDRODIURIL) 12.5 MG tablet Take 1 tablet (12.5 mg total) by mouth daily. 11/16/19   Wallis BambergMani, Mario, PA-C  ketoconazole (NIZORAL) 2 % cream APPLY 1 APPLICATION TOPICALLY DAILY 12/04/20   Edwin CapMcDonald, Adam R, DPM  nicotine (NICODERM CQ - DOSED IN MG/24 HOURS) 14 mg/24hr patch 14 mg daily. 01/29/20   [provider]      Allergies    Penicillins and Ibuprofen    Review of Systems   Review of Systems  Gastrointestinal:  Positive for abdominal distention, abdominal pain, nausea and vomiting. Negative for diarrhea.   Physical Exam Updated Vital Signs BP (!) 172/104   Pulse 83   Temp 98.6 F (37 C) (Oral)   Resp (!) 22   SpO2 95%  Physical Exam Vitals and nursing note reviewed.  Constitutional:      Appearance: He is well-developed.     Comments: Patient moaning, in pain  HENT:     Head: Normocephalic and atraumatic.  Cardiovascular:     Rate and Rhythm: Normal rate and regular rhythm.     Heart sounds: Normal heart sounds.  Pulmonary:     Effort: Pulmonary effort is normal.  Abdominal:     General: There is distension.     Palpations: Abdomen is soft.     Tenderness: There is generalized abdominal  tenderness.  Skin:    General: Skin is warm and dry.  Neurological:     Mental Status: He is alert.    ED Results / Procedures / Treatments   Labs (all labs ordered are listed, but only abnormal results are displayed) Labs Reviewed  COMPREHENSIVE METABOLIC PANEL - Abnormal; Notable for the following components:      Result Value   Glucose, Bld 114 (*)    Calcium 8.7 (*)    Total Protein 8.7 (*)    AST 58 (*)    ALT 108 (*)    Total Bilirubin 1.6 (*)    All other components within normal limits  URINALYSIS, ROUTINE W REFLEX MICROSCOPIC - Abnormal; Notable for the following components:   Hgb urine dipstick SMALL (*)    Protein, ur 100 (*)    All other components within normal limits  LACTIC ACID, PLASMA - Abnormal; Notable for the following components:   Lactic Acid, Venous 4.2 (*)    All other components within normal limits  CBC WITH DIFFERENTIAL/PLATELET - Abnormal; Notable for the following components:   MCV 100.2 (*)    MCH 34.2 (*)    All other components within normal limits  LIPASE, BLOOD  LACTIC ACID, PLASMA    EKG None  Radiology CT ABDOMEN PELVIS W CONTRAST  Result Date: 08/16/2021 CLINICAL DATA:  Nausea, vomiting and abdominal pain with suspected bowel obstruction. EXAM: CT ABDOMEN AND PELVIS WITH CONTRAST TECHNIQUE: Multidetector CT imaging of the abdomen and pelvis was performed using the standard protocol following bolus administration of intravenous contrast. RADIATION DOSE REDUCTION: This exam was performed according to the departmental dose-optimization program which includes automated exposure control, adjustment of the mA and/or kV according to patient size and/or use of iterative reconstruction technique. CONTRAST:  OMNIPAQUE IOHEXOL 300 MG/ML  SOLN COMPARISON:  CTs with IV contrast 09/12/2020 and 08/23/2019. 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 There is posterior  atelectasis both lower lobes without lung base infiltrates. Small hiatal hernia. Mild stable cardiomegaly. No pericardial effusion. Hepatobiliary: 19 cm in length moderately steatotic liver, similar appearance. No mass enhancement. Unremarkable gallbladder common bile ducts. Pancreas: No focal abnormality or ductal dilatation. Spleen: No focal abnormality or splenomegaly. Adrenals/Urinary Tract: There is no adrenal mass. There are small scattered renal cysts and scattered too small to characterize bilateral cortical hypodensities which are probably also cysts and unchanged. No new abnormality. No urinary stone or obstruction. Unremarkable bladder for the degree of distention. Stomach/Bowel: Stomach is fluid distended with backed up fluid in the duodenum and stomach. Prior broad-based ventral hernia repair is noted with multiple small bowel loops adherent to the undersurface of the mesh repair. Bowel loops above this level are dilated up to 4.5 cm, below this level and into the pelvis and terminal ileum the small bowel is collapsed. The most dilated segments contain fecal matter. There are several angulated possible transitional small bowel segments anteriorly along the mesh repair. No single definitive transitional segment could be identified. The appendix is normal. The large intestine wall is normal in thickness. There is mild fecal stasis ascending and transverse colon. Sigmoid diverticula without diverticulitis. Vascular/Lymphatic: No significant vascular findings are present. No enlarged abdominal or pelvic lymph nodes. Reproductive: Enlarged prostate, measures 4.7 cm, unchanged. Other: Intact ventral mesh hernia repair. No incarcerated hernia. Seen previously there is trace fluid in the posterior deep pelvis to the left of the rectum. There are mild mesenteric congestive features at the level of the adherent small bowel loops along the mesh. There is no free air, hemorrhage, pneumatosis or abscess.  Musculoskeletal: Again noted is advanced right and moderate left hip DJD, ankylosis across the left SI joint, mild degenerative change lumbar spine. No concerning regional bone lesion. IMPRESSION: 1. High-grade, approximately mid small-bowel obstruction appears to be at the level of the old ventral mesh hernia repair, where there are multiple adherent small bowel segments. There are several possible transitional segments deep to the mesh repair with the most dilated small bowel containing fecal material. 2. Mesenteric congestive features are noted at this level but no pneumatosis is seen. No recurrent hernia is evident. 3. Fatty liver. 4. Aortic atherosclerosis. 5. Renal cysts. 6. Prostatomegaly. 7. Asymmetric right-greater-than-left hip DJD. Electronically Signed   By: Almira Bar M.D.   On: 08/16/2021 22:40    Procedures .Critical Care Performed by: Pricilla Loveless, MD Authorized by: Pricilla Loveless, MD   Critical care provider statement:    Critical care time (minutes):  30   Critical care time was exclusive of:  Separately billable procedures and treating other patients   Critical care was necessary to treat or prevent imminent or life-threatening deterioration of the following conditions: abdominal ischemia.   Critical care was time spent personally by me on the following activities:  Development of treatment plan  with patient or surrogate, discussions with consultants, evaluation of patient's response to treatment, examination of patient, ordering and review of laboratory studies, ordering and review of radiographic studies, ordering and performing treatments and interventions, pulse oximetry, re-evaluation of patient's condition and review of old charts    Medications Ordered in ED Medications  HYDROmorphone (DILAUDID) injection 1 mg (1 mg Intravenous Given 08/16/21 2122)  lactated ringers bolus 1,000 mL (1,000 mLs Intravenous New Bag/Given 08/16/21 2122)  iohexol (OMNIPAQUE) 300 MG/ML  solution 100 mL (100 mLs Intravenous Contrast Given 08/16/21 2151)  lactated ringers bolus 1,000 mL (1,000 mLs Intravenous New Bag/Given 08/16/21 2212)    ED Course/ Medical Decision Making/ A&P                           Medical Decision Making Amount and/or Complexity of Data Reviewed External Data Reviewed: notes. Labs: ordered. Radiology: ordered and independent interpretation performed.  Risk Prescription drug management.   Patient presents with what seems like a recurrent small bowel obstruction.  He is in severe pain on my first assessment and he was given IV Dilaudid and fluids.  Kept NPO.  Lactate was sent given the degree of pain and concern for obstruction/hernia.  CT images viewed/interpreted by myself and he appears to have a high-grade small bowel obstruction.  Lactate returned at 4.2.  However after he came back from CT and after getting the pain medicine his clinical exam has significantly changed.  He is now much better and essentially has no reproducible tenderness and feels like he has minimal to no pain.  I consulted general surgery, Dr. Doylene Canard, who has seen patient.  Unfortunately, patient is now stating he wants to leave.  He is clinically doing a lot better, but I discussed it would be best for him to be at least observed, especially with a lactic acidosis.  We discussed the risk of bowel ischemia, death, etc.  He still wants to leave AMA and seems understand this.  He was informed he could return at any time and was given return precautions.        Final Clinical Impression(s) / ED Diagnoses Final diagnoses:  Small bowel obstruction [facility])    Rx / DC Orders ED Discharge Orders     None         Pricilla Loveless, MD 08/16/21 2348

## 2021-11-02 ENCOUNTER — Other Ambulatory Visit: Payer: Self-pay

## 2021-11-02 ENCOUNTER — Inpatient Hospital Stay (HOSPITAL_COMMUNITY)
Admission: EM | Admit: 2021-11-02 | Discharge: 2021-11-03 | DRG: 390 | Discharge status: Against Medical Advice | Payer: BC Managed Care – PPO | Attending: Internal Medicine | Admitting: Internal Medicine

## 2021-11-02 ENCOUNTER — Encounter (HOSPITAL_COMMUNITY): Payer: Self-pay

## 2021-11-02 ENCOUNTER — Emergency Department (HOSPITAL_COMMUNITY): Payer: BC Managed Care – PPO

## 2021-11-02 DIAGNOSIS — K565 Intestinal adhesions [bands], unspecified as to partial versus complete obstruction: Secondary | ICD-10-CM | POA: Diagnosis present

## 2021-11-02 DIAGNOSIS — Z8249 Family history of ischemic heart disease and other diseases of the circulatory system: Secondary | ICD-10-CM

## 2021-11-02 DIAGNOSIS — Z5329 Procedure and treatment not carried out because of patient's decision for other reasons: Secondary | ICD-10-CM | POA: Diagnosis present

## 2021-11-02 DIAGNOSIS — I10 Essential (primary) hypertension: Secondary | ICD-10-CM | POA: Diagnosis present

## 2021-11-02 DIAGNOSIS — Z880 Allergy status to penicillin: Secondary | ICD-10-CM | POA: Diagnosis not present

## 2021-11-02 DIAGNOSIS — Z886 Allergy status to analgesic agent status: Secondary | ICD-10-CM | POA: Diagnosis not present

## 2021-11-02 DIAGNOSIS — Z79899 Other long term (current) drug therapy: Secondary | ICD-10-CM

## 2021-11-02 DIAGNOSIS — F17210 Nicotine dependence, cigarettes, uncomplicated: Secondary | ICD-10-CM | POA: Diagnosis present

## 2021-11-02 DIAGNOSIS — K56609 Unspecified intestinal obstruction, unspecified as to partial versus complete obstruction: Secondary | ICD-10-CM | POA: Diagnosis present

## 2021-11-02 LAB — CBC WITH DIFFERENTIAL/PLATELET
Abs Immature Granulocytes: 0.01 10*3/uL (ref 0.00–0.07)
Basophils Absolute: 0 10*3/uL (ref 0.0–0.1)
Basophils Relative: 0 %
Eosinophils Absolute: 0 10*3/uL (ref 0.0–0.5)
Eosinophils Relative: 0 %
HCT: 49.7 % (ref 39.0–52.0)
Hemoglobin: 16.8 g/dL (ref 13.0–17.0)
Immature Granulocytes: 0 %
Lymphocytes Relative: 32 %
Lymphs Abs: 1.3 10*3/uL (ref 0.7–4.0)
MCH: 34.3 pg — ABNORMAL HIGH (ref 26.0–34.0)
MCHC: 33.8 g/dL (ref 30.0–36.0)
MCV: 101.4 fL — ABNORMAL HIGH (ref 80.0–100.0)
Monocytes Absolute: 0.4 10*3/uL (ref 0.1–1.0)
Monocytes Relative: 9 %
Neutro Abs: 2.5 10*3/uL (ref 1.7–7.7)
Neutrophils Relative %: 59 %
Platelets: 232 10*3/uL (ref 150–400)
RBC: 4.9 MIL/uL (ref 4.22–5.81)
RDW: 13.1 % (ref 11.5–15.5)
WBC: 4.2 10*3/uL (ref 4.0–10.5)
nRBC: 0 % (ref 0.0–0.2)

## 2021-11-02 LAB — URINALYSIS, ROUTINE W REFLEX MICROSCOPIC
Bacteria, UA: NONE SEEN
Bilirubin Urine: NEGATIVE
Glucose, UA: NEGATIVE mg/dL
Hgb urine dipstick: NEGATIVE
Ketones, ur: NEGATIVE mg/dL
Leukocytes,Ua: NEGATIVE
Nitrite: NEGATIVE
Protein, ur: 100 mg/dL — AB
Specific Gravity, Urine: 1.035 — ABNORMAL HIGH (ref 1.005–1.030)
pH: 7 (ref 5.0–8.0)

## 2021-11-02 LAB — COMPREHENSIVE METABOLIC PANEL
ALT: 55 U/L — ABNORMAL HIGH (ref 0–44)
AST: 29 U/L (ref 15–41)
Albumin: 4.3 g/dL (ref 3.5–5.0)
Alkaline Phosphatase: 52 U/L (ref 38–126)
Anion gap: 10 (ref 5–15)
BUN: 7 mg/dL (ref 6–20)
CO2: 24 mmol/L (ref 22–32)
Calcium: 9.7 mg/dL (ref 8.9–10.3)
Chloride: 107 mmol/L (ref 98–111)
Creatinine, Ser: 0.79 mg/dL (ref 0.61–1.24)
GFR, Estimated: >60 mL/min (ref >60)
Glucose, Bld: 114 mg/dL — ABNORMAL HIGH (ref 70–99)
Potassium: 4 mmol/L (ref 3.5–5.1)
Sodium: 141 mmol/L (ref 135–145)
Total Bilirubin: 1.4 mg/dL — ABNORMAL HIGH (ref 0.3–1.2)
Total Protein: 9.1 g/dL — ABNORMAL HIGH (ref 6.5–8.1)

## 2021-11-02 LAB — LIPASE, BLOOD: Lipase: 30 U/L (ref 11–51)

## 2021-11-02 MED ORDER — SODIUM CHLORIDE 0.9 % IV SOLN: INTRAVENOUS | Status: DC

## 2021-11-02 MED ORDER — ONDANSETRON HCL 4 MG/2ML IJ SOLN
4.0000 mg | Freq: Once | INTRAMUSCULAR | Status: AC
  Administered 2021-11-02: 4 mg via INTRAVENOUS
  Filled 2021-11-02: qty 2

## 2021-11-02 MED ORDER — HYDROMORPHONE HCL 2 MG/ML IJ SOLN
1.0000 mg | Freq: Once | INTRAMUSCULAR | Status: AC
  Administered 2021-11-02: 1 mg via INTRAVENOUS
  Filled 2021-11-02: qty 1

## 2021-11-02 MED ORDER — SODIUM CHLORIDE (PF) 0.9 % IJ SOLN
INTRAMUSCULAR | Status: AC
  Filled 2021-11-02: qty 50

## 2021-11-02 MED ORDER — SODIUM CHLORIDE 0.9 % IV BOLUS
1000.0000 mL | Freq: Once | INTRAVENOUS | Status: AC
  Administered 2021-11-02: 1000 mL via INTRAVENOUS

## 2021-11-02 MED ORDER — LORAZEPAM 2 MG/ML IJ SOLN
1.0000 mg | Freq: Once | INTRAMUSCULAR | Status: AC
  Administered 2021-11-02: 1 mg via INTRAVENOUS
  Filled 2021-11-02: qty 1

## 2021-11-02 MED ORDER — HYDROMORPHONE HCL 2 MG/ML IJ SOLN
1.0000 mg | INTRAMUSCULAR | Status: DC | PRN
  Administered 2021-11-02 – 2021-11-03 (×3): 1 mg via INTRAVENOUS
  Filled 2021-11-02 (×3): qty 1

## 2021-11-02 MED ORDER — HYDROMORPHONE HCL 2 MG/ML IJ SOLN: 1.0000 mg | INTRAMUSCULAR | Status: DC | PRN

## 2021-11-02 MED ORDER — ONDANSETRON HCL 4 MG/2ML IJ SOLN
4.0000 mg | Freq: Four times a day (QID) | INTRAMUSCULAR | Status: DC | PRN
  Administered 2021-11-03: 4 mg via INTRAVENOUS
  Filled 2021-11-02: qty 2

## 2021-11-02 MED ORDER — HYDRALAZINE HCL 20 MG/ML IJ SOLN
5.0000 mg | Freq: Four times a day (QID) | INTRAMUSCULAR | Status: DC | PRN
  Administered 2021-11-02: 5 mg via INTRAVENOUS
  Filled 2021-11-02: qty 1

## 2021-11-02 MED ORDER — HYDROMORPHONE HCL 2 MG/ML IJ SOLN: 1.0000 mg | Freq: Once | INTRAMUSCULAR | Status: DC

## 2021-11-02 MED ORDER — NALOXONE HCL 0.4 MG/ML IJ SOLN
0.4000 mg | INTRAMUSCULAR | Status: DC | PRN
Start: 2021-11-02 — End: 2021-11-04

## 2021-11-02 MED ORDER — ACETAMINOPHEN 325 MG PO TABS: 650.0000 mg | ORAL_TABLET | Freq: Four times a day (QID) | ORAL | Status: DC | PRN

## 2021-11-02 MED ORDER — ACETAMINOPHEN 650 MG RE SUPP: 650.0000 mg | Freq: Four times a day (QID) | RECTAL | Status: DC | PRN

## 2021-11-02 MED ORDER — IOHEXOL 300 MG/ML  SOLN
100.0000 mL | Freq: Once | INTRAMUSCULAR | Status: AC | PRN
Start: 2021-11-02 — End: 2021-11-02
  Administered 2021-11-02: 100 mL via INTRAVENOUS

## 2021-11-02 NOTE — Consult Note (Signed)
Robert Howe June 13, 1965  [id number].    Requesting MD: Theophilus Kinds, PA-C Chief Complaint/Reason for Consult: abdominal pain, small bowel obstruction  HPI:  Robert Howe is a 56 yo male with a prior history of SBO who presented to the ED today with abdominal pain. His pain began yesterday after he ate some strawberries, and he has not passed flatus today or had a bowel movement in two days. He reports several episodes of nausea and vomiting today before he came to the ED. Labs in the ED are unremarkable, and WBC is normal. A CT scan showed dilated small bowel loops, concerning for a small bowel obstruction.  He has had multiple prior similar episodes, most recently in June of this year, at which time he had a CT in the ED that showed similar findings. He declined NG placement and hospital admission at that time. His only prior abdominal surgery is a ventral hernia repair, which he says was done in [place] in 2015.    ROS: Review of Systems  Constitutional:  Negative for chills and fever.  Respiratory:  Negative for shortness of breath.   Cardiovascular:  Negative for chest pain.  Gastrointestinal:  Positive for abdominal pain, nausea and vomiting.  Skin:  Negative for rash.    Family History  Problem Relation Age of Onset   Hypertension Mother     Past Medical History:  Diagnosis Date   Hypertension     Past Surgical History:  Procedure Laterality Date   HERNIA REPAIR      Social History:  reports that he has been smoking cigarettes. He has never used smokeless tobacco. He reports current alcohol use. He reports that he does not use drugs.  Allergies:  Allergies  Allergen Reactions   Penicillins Anaphylaxis and Hives   Ibuprofen Other (See Comments)    (Not in a hospital admission)    Physical Exam: Blood pressure (!) 181/102, pulse 71, temperature 98.9 F (37.2 C), temperature source Oral, resp. rate (!) 21, SpO2 91 %. General: resting comfortably,  NAD Neuro: alert and oriented, no focal deficits Resp: normal work of breathing, lungs CTAB CV: RRR Abdomen: distended but compressible, tender to palpation, well-healed midline surgical scar. Extremities: warm and well-perfused   Results for orders placed or performed during the hospital encounter of 11/02/21 (from the past 48 hour(s))  CBC with Differential     Status: Abnormal   Collection Time: 11/02/21  5:00 PM  Result Value Ref Range   WBC 4.2 4.0 - 10.5 K/uL   RBC 4.90 4.22 - 5.81 MIL/uL   Hemoglobin 16.8 13.0 - 17.0 g/dL   HCT 47.4 25.9 - 56.3 %   MCV 101.4 (H) 80.0 - 100.0 fL   MCH 34.3 (H) 26.0 - 34.0 pg   MCHC 33.8 30.0 - 36.0 g/dL   RDW 87.5 64.3 - 32.9 %   Platelets 232 150 - 400 K/uL   nRBC 0.0 0.0 - 0.2 %   Neutrophils Relative % 59 %   Neutro Abs 2.5 1.7 - 7.7 K/uL   Lymphocytes Relative 32 %   Lymphs Abs 1.3 0.7 - 4.0 K/uL   Monocytes Relative 9 %   Monocytes Absolute 0.4 0.1 - 1.0 K/uL   Eosinophils Relative 0 %   Eosinophils Absolute 0.0 0.0 - 0.5 K/uL   Basophils Relative 0 %   Basophils Absolute 0.0 0.0 - 0.1 K/uL   Immature Granulocytes 0 %   Abs Immature Granulocytes 0.01 0.00 - 0.07 K/uL  Comment: Performed at [facility], [place] [address] [place], [place] [address]  Comprehensive metabolic panel     Status: Abnormal   Collection Time: 11/02/21  5:00 PM  Result Value Ref Range   Sodium 141 135 - 145 mmol/L   Potassium 4.0 3.5 - 5.1 mmol/L   Chloride 107 98 - 111 mmol/L   CO2 24 22 - 32 mmol/L   Glucose, Bld 114 (H) 70 - 99 mg/dL    Comment: Glucose reference range applies only to samples taken after fasting for at least 8 hours.   BUN 7 6 - 20 mg/dL   Creatinine, Ser 0.79 0.61 - 1.24 mg/dL   Calcium 9.7 8.9 - 10.3 mg/dL   Total Protein 9.1 (H) 6.5 - 8.1 g/dL   Albumin 4.3 3.5 - 5.0 g/dL   AST 29 15 - 41 U/L   ALT 55 (H) 0 - 44 U/L   Alkaline Phosphatase 52 38 - 126 U/L   Total Bilirubin 1.4 (H) 0.3 - 1.2 mg/dL   GFR,  Estimated >60 >60 mL/min    Comment: (NOTE) Calculated using the CKD-EPI Creatinine Equation (2021)    Anion gap 10 5 - 15    Comment: Performed at [facility], [place] [address], [place], [place] [address]  Lipase, blood     Status: None   Collection Time: 11/02/21  5:00 PM  Result Value Ref Range   Lipase 30 11 - 51 U/L    Comment: Performed at [facility], [place] [address], [place], [place] [address]   CT Abdomen Pelvis W Contrast  Result Date: 11/02/2021 CLINICAL DATA:  Bowel obstruction suspected. Abdominal pain, nausea, vomiting EXAM: CT ABDOMEN AND PELVIS WITH CONTRAST TECHNIQUE: Multidetector CT imaging of the abdomen and pelvis was performed using the standard protocol following bolus administration of intravenous contrast. RADIATION DOSE REDUCTION: This exam was performed according to the departmental dose-optimization program which includes automated exposure control, adjustment of the mA and/or kV according to patient size and/or use of iterative reconstruction technique. CONTRAST:  128mL OMNIPAQUE IOHEXOL 300 MG/ML  SOLN COMPARISON:  08/16/2021 FINDINGS: Lower chest: Small right posterior diaphragmatic hernia containing fat. No acute abnormality. Hepatobiliary: Diffuse low-density throughout the liver compatible with fatty infiltration. No focal abnormality. Gallbladder unremarkable. Pancreas: No focal abnormality or ductal dilatation. Spleen: No focal abnormality.  Normal size. Adrenals/Urinary Tract: Small scattered renal cysts bilaterally are stable since prior study. No follow-up imaging recommended. No stones or hydronephrosis. Adrenal glands and urinary bladder unremarkable. Stomach/Bowel: Normal appendix. Prior ventral hernia repair. There are abnormal dilated small bowel loops anteriorly in the abdomen underlying the ventral hernia repair site, some of which are fluid-filled and others contain fecal material. Distal small bowel is decompressed  findings compatible with small bowel obstruction. Exact point of transition or cause not visualized. Findings are similar to prior study. Vascular/Lymphatic: No evidence of aneurysm or adenopathy. Reproductive: No visible focal abnormality. Other: No free fluid or free air. Musculoskeletal: No acute bony abnormality. IMPRESSION: Prior ventral hernia pair. There are abnormal dilated small bowel loops anteriorly under the ventral hernia repair likely adherent to the anterior abdominal wall. Findings are similar to prior study and most compatible with small bowel obstruction. Fatty liver. Electronically Signed   By: Rolm Baptise M.D.   On: 11/02/2021 19:34      Assessment/Plan This is a 56 yo male with a recurrent small bowel obstruction, presumably adhesive. I have reviewed his CT from today, which shows  dilated small bowel loops with fecalization, and decompressed distal bowel. There is no pneumatosis, intraabdominal free fluid, portal venous gas or mesenteric swirling. He does have ongoing pain but there are no imaging or laboratory findings to suggest bowel ischemia. His CT today overall appears stable compared to his most recent CT in June, and is also similar to a CT in June of last year, suggesting a chronic component. I recommended NG decompression and the patient adamantly refused. I discussed that it would help with his nausea and pain, and he continued to decline.  - NPO, IV fluid hydration - If patient agrees to NG placement, please place a 16 or 18-Fr tube LIS - Begin SBO protocol after NG placement, or if patient continues to refuse, plan for administration of oral contrast by mouth. - No indication for emergent surgical intervention. If symptoms do not resolve with nonoperative management, or if exam worsens, patient may ultimately need surgery, especially since he has had multiple obstructive episodes. - Surgery will follow closely  Sophronia Simas, MD [facility] Surgery General,  Hepatobiliary and Pancreatic Surgery 11/02/21 9:09 PM

## 2021-11-02 NOTE — ED Triage Notes (Signed)
Pt arrives via EMS from home. Reports abdominal pain, nausea, vomiting, constipation and distention x 2 days.

## 2021-11-02 NOTE — ED Notes (Signed)
Pt sat in mid 80s. Pt placed on 2L 

## 2021-11-02 NOTE — ED Provider Notes (Signed)
[facility] DEPT Provider Note   CSN: [id number] Arrival date & time: 11/02/21  1550     History Medical history of hernia repair, bowel obstruction and hypertension Chief Complaint  Patient presents with   Abdominal Pain   Nausea   Emesis   Constipation    Robert Howe is a 56 y.o. male. Patient presents the ED with 2 days of generalized abdominal pain, abdominal distention, nausea, vomiting, and constipation.  Last bowel movement was 2 days ago.  He is not passing gas. He has not ate or drink anything in 2 days. He says this feels similar to his prior bowel obstructions.    Abdominal Pain Associated symptoms: constipation, nausea and vomiting   Associated symptoms: no chest pain, no diarrhea, no dysuria, no fever, no hematuria and no shortness of breath   Emesis Associated symptoms: abdominal pain   Associated symptoms: no diarrhea and no fever   Constipation Associated symptoms: abdominal pain, nausea and vomiting   Associated symptoms: no diarrhea, no dysuria and no fever        Home Medications Prior to Admission medications   Medication Sig Start Date End Date Taking? Authorizing Provider  amLODipine (NORVASC) 5 MG tablet Take 5 mg by mouth at bedtime. 03/20/20   [provider]  hydrochlorothiazide (HYDRODIURIL) 12.5 MG tablet Take 1 tablet (12.5 mg total) by mouth daily. 11/16/19   Jaynee Eagles, PA-C  ketoconazole (NIZORAL) 2 % cream APPLY 1 APPLICATION TOPICALLY DAILY 12/04/20   Criselda Peaches, DPM  nicotine (NICODERM CQ - DOSED IN MG/24 HOURS) 14 mg/24hr patch 14 mg daily. 01/29/20   [provider]      Allergies    Penicillins and Ibuprofen    Review of Systems   Review of Systems  Constitutional:  Negative for fever.  Respiratory:  Negative for shortness of breath.   Cardiovascular:  Negative for chest pain.  Gastrointestinal:  Positive for abdominal distention, abdominal pain, constipation, nausea and  vomiting. Negative for diarrhea.  Genitourinary:  Negative for dysuria, flank pain and hematuria.  All other systems reviewed and are negative.   Physical Exam Updated Vital Signs BP (!) 181/102   Pulse 74   Temp 98.9 F (37.2 C) (Oral)   Resp (!) 23   SpO2 (!) 81%  Physical Exam Vitals and nursing note reviewed.  Constitutional:      General: He is not in acute distress.    Appearance: Normal appearance. He is not ill-appearing, toxic-appearing or diaphoretic.  HENT:     Head: Normocephalic and atraumatic.     Nose: No nasal deformity.     Mouth/Throat:     Lips: Pink. No lesions.     Mouth: Mucous membranes are moist. No injury, lacerations, oral lesions or angioedema.     Pharynx: Oropharynx is clear. Uvula midline. No pharyngeal swelling, oropharyngeal exudate, posterior oropharyngeal erythema or uvula swelling.  Eyes:     General: Gaze aligned appropriately. No scleral icterus.       Right eye: No discharge.        Left eye: No discharge.     Conjunctiva/sclera: Conjunctivae normal.     Right eye: Right conjunctiva is not injected. No exudate or hemorrhage.    Left eye: Left conjunctiva is not injected. No exudate or hemorrhage.    Pupils: Pupils are equal, round, and reactive to light.  Cardiovascular:     Rate and Rhythm: Normal rate and regular rhythm.     Pulses: Normal  pulses.          Radial pulses are 2+ on the right side and 2+ on the left side.       Dorsalis pedis pulses are 2+ on the right side and 2+ on the left side.     Heart sounds: Normal heart sounds, S1 normal and S2 normal. Heart sounds not distant. No murmur heard.    No friction rub. No gallop. No S3 or S4 sounds.  Pulmonary:     Effort: Pulmonary effort is normal. No accessory muscle usage or respiratory distress.     Breath sounds: Normal breath sounds. No stridor. No wheezing, rhonchi or rales.  Chest:     Chest wall: No tenderness.  Abdominal:     General: Abdomen is flat. Bowel sounds are  decreased. There is no distension.     Palpations: Abdomen is soft. There is no mass or pulsatile mass.     Tenderness: There is generalized abdominal tenderness. There is guarding. There is no rebound.     Hernia: No hernia is present.     Comments: Severe generalized tenderness to abdomen.  Musculoskeletal:     Right lower leg: No edema.     Left lower leg: No edema.  Skin:    General: Skin is warm and dry.     Coloration: Skin is not jaundiced or pale.     Findings: No bruising, erythema, lesion or rash.  Neurological:     General: No focal deficit present.     Mental Status: He is alert and oriented to person, place, and time.     GCS: GCS eye subscore is 4. GCS verbal subscore is 5. GCS motor subscore is 6.  Psychiatric:        Mood and Affect: Mood normal.        Behavior: Behavior normal. Behavior is cooperative.     ED Results / Procedures / Treatments   Labs (all labs ordered are listed, but only abnormal results are displayed) Labs Reviewed  CBC WITH DIFFERENTIAL/PLATELET - Abnormal; Notable for the following components:      Result Value   MCV 101.4 (*)    MCH 34.3 (*)    All other components within normal limits  COMPREHENSIVE METABOLIC PANEL - Abnormal; Notable for the following components:   Glucose, Bld 114 (*)    Total Protein 9.1 (*)    ALT 55 (*)    Total Bilirubin 1.4 (*)    All other components within normal limits  LIPASE, BLOOD  URINALYSIS, ROUTINE W REFLEX MICROSCOPIC  LACTIC ACID, PLASMA  LACTIC ACID, PLASMA    EKG None  Radiology CT Abdomen Pelvis W Contrast  Result Date: 11/02/2021 CLINICAL DATA:  Bowel obstruction suspected. Abdominal pain, nausea, vomiting EXAM: CT ABDOMEN AND PELVIS WITH CONTRAST TECHNIQUE: Multidetector CT imaging of the abdomen and pelvis was performed using the standard protocol following bolus administration of intravenous contrast. RADIATION DOSE REDUCTION: This exam was performed according to the departmental  dose-optimization program which includes automated exposure control, adjustment of the mA and/or kV according to patient size and/or use of iterative reconstruction technique. CONTRAST:  OMNIPAQUE IOHEXOL 300 MG/ML  SOLN COMPARISON:  08/16/2021 FINDINGS: Lower chest: Small right posterior diaphragmatic hernia containing fat. No acute abnormality. Hepatobiliary: Diffuse low-density throughout the liver compatible with fatty infiltration. No focal abnormality. Gallbladder unremarkable. Pancreas: No focal abnormality or ductal dilatation. Spleen: No focal abnormality.  Normal size. Adrenals/Urinary Tract: Small scattered renal cysts bilaterally are stable since  prior study. No follow-up imaging recommended. No stones or hydronephrosis. Adrenal glands and urinary bladder unremarkable. Stomach/Bowel: Normal appendix. Prior ventral hernia repair. There are abnormal dilated small bowel loops anteriorly in the abdomen underlying the ventral hernia repair site, some of which are fluid-filled and others contain fecal material. Distal small bowel is decompressed findings compatible with small bowel obstruction. Exact point of transition or cause not visualized. Findings are similar to prior study. Vascular/Lymphatic: No evidence of aneurysm or adenopathy. Reproductive: No visible focal abnormality. Other: No free fluid or free air. Musculoskeletal: No acute bony abnormality. IMPRESSION: Prior ventral hernia pair. There are abnormal dilated small bowel loops anteriorly under the ventral hernia repair likely adherent to the anterior abdominal wall. Findings are similar to prior study and most compatible with small bowel obstruction. Fatty liver. Electronically Signed   By: Charlett Nose M.D.   On: 11/02/2021 19:34    Procedures Procedures   Medications Ordered in ED Medications  sodium chloride 0.9 % bolus 1,000 mL (0 mLs Intravenous Stopped 11/02/21 1832)  HYDROmorphone (DILAUDID) injection 1 mg (1 mg Intravenous  Given 11/02/21 1703)  ondansetron (ZOFRAN) injection 4 mg (4 mg Intravenous Given 11/02/21 1703)  iohexol (OMNIPAQUE) 300 MG/ML solution 100 mL (100 mLs Intravenous Contrast Given 11/02/21 1855)  sodium chloride (PF) 0.9 % injection (  Given by Other 11/02/21 1953)  HYDROmorphone (DILAUDID) injection 1 mg (1 mg Intravenous Given 11/02/21 1947)    ED Course/ Medical Decision Making/ A&P Clinical Course as of 11/02/21 2016  Sun Nov 02, 2021  8299 Reassessment reveals improved symptoms  [GL]  1941 Discussed case with Sophronia Simas with general surgery. They plan to consult on this patient to evaluate. Plan to admit to medicine for SBO. [GL]    Clinical Course User Index [GL] Victorino Dike Finis Bud, PA-C                           Medical Decision Making Amount and/or Complexity of Data Reviewed Labs: ordered. Radiology: ordered.  Risk Prescription drug management. Decision regarding hospitalization.    MDM  This is a 56 y.o. male who presents to the ED with abdominal pain The differential of this patient includes but is not limited to AAA, DKA, Mesenteric Ischemia, Perforated Viscus, SBO, Acute gastroenteritis  Initial Impression  Patient is in significant mount of pain and yelling out for help.  He does have stable vitals and is afebrile.  He is hypertensive likely in the setting of pain. Exam shows distended abdomen, generalized abdominal tenderness.  Similar presentation in the past when diagnosed with bowel obstructions.  I suspect this is what is going on today.  Plan to get CT abdomen pelvis and give IV fluids for now. Treating pain with Dilaudid  I personally ordered, reviewed, and interpreted all laboratory work and imaging and agree with radiologist interpretation. Results interpreted below:  No leukocytosis or anemia noted.  CMP shows stable renal function, stable ALT elevation with no other abnormal LFTs, electrolytes are okay.  Lipase is 30.  Lactate and UA are pending CT abdomen  pelvis consistent with abnormal dilated small bowel loops anteriorly under the ventral hernia repair likely adherent to the anterior abdominal wall, compatible with small bowel obstruction.  Assessment/Plan:  Small bowel obstruction consistent with patient presentation.  I discussed these results with the patient and he is continued to have pain.  He will be given another dose of Dilaudid.  I discussed NG tube with  the patient and he refuses this at this time as he had a prior bad experience. He is not currently vomiting, and is agreeable to being admitted to the hospital.  I discussed this case with Michaelle Birks with general surgery.  She will evaluate patient with plans to admit to medicine.  Spoke with Dr. Marlowe Sax for admission.    Charting Requirements Additional history is obtained from:  Independent historian External Records from outside source obtained and reviewed including: Prior CT imaging of the abdomen Social Determinants of Health:  none Pertinant PMH that complicates patient's illness: hx of SBO  Patient Care Problems that were addressed during this visit: - Small Bowel Obstruction: Acute illness with complication This patient was maintained on a cardiac monitor/telemetry. I personally viewed and interpreted the cardiac monitor which reveals an underlying rhythm of NSR Medications given in ED: Dilaudid, IVF, zofran Reevaluation of the patient after these medicines showed that the patient improved I have reviewed home medications and made changes accordingly.  Critical Care Interventions: n/a Consultations: General Surgery, [facility] Disposition: Admit  Portions of this note were generated with Dragon dictation software. Dictation errors may occur despite best attempts at proofreading.      Final Clinical Impression(s) / ED Diagnoses Final diagnoses:  Small bowel obstruction [facility])    Rx / DC Orders ED Discharge Orders     None         Sheila Oats 11/02/21 2016    Lacretia Leigh, MD 11/05/21 [phone]

## 2021-11-02 NOTE — H&P (Signed)
History and Physical    Robert Howe [id number] DOB: [birth year] DOA: 11/02/2021  PCP: Patient, No Pcp Per  Patient coming from: Home  Chief Complaint: Abdominal pain  HPI: Robert Howe is a 56 y.o. male with medical history significant of hypertension, laparoscopic abdominal hernia repair, recurrent SBO presenting to the ED with complaints of abdominal pain and distention, nausea, vomiting, and constipation.  On arrival to ED, patient was hypertensive but remainder of vital signs stable.  Labs showing no leukocytosis, hemoglobin normal, creatinine 0.7, ALT mildly elevated but improved compared to prior labs, T. bili mildly elevated and stable, AST and alkaline phosphatase normal, lipase normal, UA pending, lactic acid pending.  CT abdomen pelvis showing findings consistent with SBO. Patient declined NG tube placement.  ED PA discussed the case with Dr. Freida Busman with general surgery, will consult.  Patient was given doses of Dilaudid, Zofran, and 1 L normal saline bolus.  When I entered the room, patient was moaning and screaming.  He was extremely upset that his pain was not being managed.  He reported 2-day history of generalized abdominal pain, distention, nausea, and vomiting.  Last bowel movement was 3 days ago.  Patient did not answer any further questions and requested more pain medications.  I discussed NG tube placement but he became extremely upset and adamantly declined.  Told me that he had NG tube placed in the past and it never helped him.  Told me every time he was admitted for bowel obstruction in the past, no one helped him.  Review of Systems:  Review of Systems  All other systems reviewed and are negative.   Past Medical History:  Diagnosis Date   Hypertension     Past Surgical History:  Procedure Laterality Date   HERNIA REPAIR       reports that he has been smoking cigarettes. He has never used smokeless tobacco. He reports current alcohol use. He reports  that he does not use drugs.  Allergies  Allergen Reactions   Penicillins Anaphylaxis and Hives   Ibuprofen Other (See Comments)    Family History  Problem Relation Age of Onset   Hypertension Mother     Prior to Admission medications   Medication Sig Start Date End Date Taking? Authorizing Provider  amLODipine (NORVASC) 5 MG tablet Take 5 mg by mouth at bedtime. 03/20/20   [provider]  hydrochlorothiazide (HYDRODIURIL) 12.5 MG tablet Take 1 tablet (12.5 mg total) by mouth daily. 11/16/19   Wallis Bamberg, PA-C  ketoconazole (NIZORAL) 2 % cream APPLY 1 APPLICATION TOPICALLY DAILY 12/04/20   Edwin Cap, DPM  nicotine (NICODERM CQ - DOSED IN MG/24 HOURS) 14 mg/24hr patch 14 mg daily. 01/29/20   [provider]    Physical Exam: Vitals:   11/02/21 1637 11/02/21 1836 11/02/21 2000 11/02/21 2015  BP: (!) 188/103 (!) 163/98 (!) 181/102   Pulse: 66 78 74 71  Resp: (!) 24 14 (!) 23 (!) 21  Temp: 97.7 F (36.5 C) 98.9 F (37.2 C)    TempSrc: Oral Oral    SpO2: 100% 95% (!) 81% 91%    Physical Exam Vitals reviewed.  Constitutional:      General: He is in acute distress.  HENT:     Head: Normocephalic and atraumatic.  Eyes:     Extraocular Movements: Extraocular movements intact.  Cardiovascular:     Rate and Rhythm: Normal rate and regular rhythm.     Pulses: Normal pulses.  Pulmonary:  Effort: Pulmonary effort is normal. No respiratory distress.     Breath sounds: Normal breath sounds.  Abdominal:     Comments: Significantly distended.  Patient did not allow me to examine his abdomen.  Musculoskeletal:     Right lower leg: No edema.     Left lower leg: No edema.  Skin:    General: Skin is warm and dry.  Neurological:     General: No focal deficit present.     Mental Status: He is alert and oriented to person, place, and time.      Labs on Admission: I have personally reviewed following labs and imaging studies  CBC: Recent Labs  Lab  11/02/21 1700  WBC 4.2  NEUTROABS 2.5  HGB 16.8  HCT 49.7  MCV 101.4*  PLT 232   Basic Metabolic Panel: Recent Labs  Lab 11/02/21 1700  NA 141  K 4.0  CL 107  CO2 24  GLUCOSE 114*  BUN 7  CREATININE 0.79  CALCIUM 9.7   GFR: CrCl cannot be calculated (Unknown ideal weight.). Liver Function Tests: Recent Labs  Lab 11/02/21 1700  AST 29  ALT 55*  ALKPHOS 52  BILITOT 1.4*  PROT 9.1*  ALBUMIN 4.3   Recent Labs  Lab 11/02/21 1700  LIPASE 30   No results for input(s): "AMMONIA" in the last 168 hours. Coagulation Profile: No results for input(s): "INR", "PROTIME" in the last 168 hours. Cardiac Enzymes: No results for input(s): "CKTOTAL", "CKMB", "CKMBINDEX", "TROPONINI" in the last 168 hours. BNP (last 3 results) No results for input(s): "PROBNP" in the last 8760 hours. HbA1C: No results for input(s): "HGBA1C" in the last 72 hours. CBG: No results for input(s): "GLUCAP" in the last 168 hours. Lipid Profile: No results for input(s): "CHOL", "HDL", "LDLCALC", "TRIG", "CHOLHDL", "LDLDIRECT" in the last 72 hours. Thyroid Function Tests: No results for input(s): "TSH", "T4TOTAL", "FREET4", "T3FREE", "THYROIDAB" in the last 72 hours. Anemia Panel: No results for input(s): "VITAMINB12", "FOLATE", "FERRITIN", "TIBC", "IRON", "RETICCTPCT" in the last 72 hours. Urine analysis:    Component Value Date/Time   COLORURINE YELLOW 08/16/2021 2035   APPEARANCEUR CLEAR 08/16/2021 2035   LABSPEC 1.017 08/16/2021 2035   PHURINE 5.0 08/16/2021 2035   GLUCOSEU NEGATIVE 08/16/2021 2035   HGBUR SMALL (A) 08/16/2021 2035   BILIRUBINUR NEGATIVE 08/16/2021 2035   KETONESUR NEGATIVE 08/16/2021 2035   PROTEINUR 100 (A) 08/16/2021 2035   NITRITE NEGATIVE 08/16/2021 2035   LEUKOCYTESUR NEGATIVE 08/16/2021 2035    Radiological Exams on Admission: I have personally reviewed images CT Abdomen Pelvis W Contrast  Result Date: 11/02/2021 CLINICAL DATA:  Bowel obstruction suspected.  Abdominal pain, nausea, vomiting EXAM: CT ABDOMEN AND PELVIS WITH CONTRAST TECHNIQUE: Multidetector CT imaging of the abdomen and pelvis was performed using the standard protocol following bolus administration of intravenous contrast. RADIATION DOSE REDUCTION: This exam was performed according to the departmental dose-optimization program which includes automated exposure control, adjustment of the mA and/or kV according to patient size and/or use of iterative reconstruction technique. CONTRAST:  OMNIPAQUE IOHEXOL 300 MG/ML  SOLN COMPARISON:  08/16/2021 FINDINGS: Lower chest: Small right posterior diaphragmatic hernia containing fat. No acute abnormality. Hepatobiliary: Diffuse low-density throughout the liver compatible with fatty infiltration. No focal abnormality. Gallbladder unremarkable. Pancreas: No focal abnormality or ductal dilatation. Spleen: No focal abnormality.  Normal size. Adrenals/Urinary Tract: Small scattered renal cysts bilaterally are stable since prior study. No follow-up imaging recommended. No stones or hydronephrosis. Adrenal glands and urinary bladder unremarkable. Stomach/Bowel: Normal  appendix. Prior ventral hernia repair. There are abnormal dilated small bowel loops anteriorly in the abdomen underlying the ventral hernia repair site, some of which are fluid-filled and others contain fecal material. Distal small bowel is decompressed findings compatible with small bowel obstruction. Exact point of transition or cause not visualized. Findings are similar to prior study. Vascular/Lymphatic: No evidence of aneurysm or adenopathy. Reproductive: No visible focal abnormality. Other: No free fluid or free air. Musculoskeletal: No acute bony abnormality. IMPRESSION: Prior ventral hernia pair. There are abnormal dilated small bowel loops anteriorly under the ventral hernia repair likely adherent to the anterior abdominal wall. Findings are similar to prior study and most compatible with small  bowel obstruction. Fatty liver. Electronically Signed   By: Charlett Nose M.D.   On: 11/02/2021 19:34    EKG: No EKG done in the ED.  Assessment and Plan  Recurrent SBO History of ventral hernia repair Patient has significant abdominal pain despite receiving doses of Dilaudid in the ED.  I discussed NG tube placement but he became extremely upset and adamantly declined.  I spoke to Dr. Freida Busman who informed me that she has already seen the patient in the ED and spoke to him but he declined NG tube placement.  Dr. Freida Busman informed me that she has reviewed the patient's CT and there are no signs of pneumatosis, she feels that there is no need for emergency surgery. -Bowel rest/keep n.p.o. -IV fluid hydration -Continue Dilaudid as needed for severe pain -Antiemetic as needed, EKG ordered to check QT interval  Hypertension Patient hypertensive with systolic in the 160s to 180s, pain likely contributing. -Continue pain management -IV hydralazine prn  DVT prophylaxis: SCDs Code Status: Full Code by default.  Unable to discuss CODE STATUS with the patient at this time as he is extremely upset.  Please readdress in the morning. Family Communication: No family available at this time. Consults called: General surgery Level of care: Med-Surg Admission status: It is my clinical opinion that admission to INPATIENT is reasonable and necessary because of the expectation that this patient will require hospital care that crosses at least 2 midnights to treat this condition based on the medical complexity of the problems presented.  Given the aforementioned information, the predictability of an adverse outcome is felt to be significant.   John Giovanni MD [facility]  If 7PM-7AM, please contact night-coverage [email or web address]  11/02/2021, 8:39 PM

## 2021-11-03 ENCOUNTER — Inpatient Hospital Stay (HOSPITAL_COMMUNITY): Payer: BC Managed Care – PPO

## 2021-11-03 DIAGNOSIS — I10 Essential (primary) hypertension: Secondary | ICD-10-CM | POA: Diagnosis not present

## 2021-11-03 DIAGNOSIS — K56609 Unspecified intestinal obstruction, unspecified as to partial versus complete obstruction: Secondary | ICD-10-CM | POA: Diagnosis not present

## 2021-11-03 LAB — BASIC METABOLIC PANEL
Anion gap: 9 (ref 5–15)
BUN: 10 mg/dL (ref 6–20)
CO2: 25 mmol/L (ref 22–32)
Calcium: 9 mg/dL (ref 8.9–10.3)
Chloride: 110 mmol/L (ref 98–111)
Creatinine, Ser: 0.81 mg/dL (ref 0.61–1.24)
GFR, Estimated: >60 mL/min (ref >60)
Glucose, Bld: 161 mg/dL — ABNORMAL HIGH (ref 70–99)
Potassium: 3.7 mmol/L (ref 3.5–5.1)
Sodium: 144 mmol/L (ref 135–145)

## 2021-11-03 LAB — HIV ANTIBODY (ROUTINE TESTING W REFLEX): HIV Screen 4th Generation wRfx: NONREACTIVE

## 2021-11-03 MED ORDER — POTASSIUM CHLORIDE IN NACL 20-0.45 MEQ/L-% IV SOLN
INTRAVENOUS | Status: DC
  Filled 2021-11-03 (×2): qty 1000

## 2021-11-03 MED ORDER — LACTATED RINGERS IV BOLUS: 1000.0000 mL | Freq: Three times a day (TID) | INTRAVENOUS | Status: DC | PRN

## 2021-11-03 MED ORDER — SIMETHICONE 80 MG PO CHEW
80.0000 mg | CHEWABLE_TABLET | Freq: Four times a day (QID) | ORAL | Status: DC
  Administered 2021-11-03: 80 mg via ORAL
  Filled 2021-11-03 (×2): qty 1

## 2021-11-03 MED ORDER — HYDRALAZINE HCL 20 MG/ML IJ SOLN
10.0000 mg | Freq: Four times a day (QID) | INTRAMUSCULAR | Status: DC | PRN
Start: 2021-11-03 — End: 2021-11-04
  Administered 2021-11-03: 10 mg via INTRAVENOUS
  Filled 2021-11-03 (×2): qty 1

## 2021-11-03 MED ORDER — SODIUM CHLORIDE 0.9 % IV SOLN: 8.0000 mg | Freq: Four times a day (QID) | INTRAVENOUS | Status: DC | PRN

## 2021-11-03 MED ORDER — MAGIC MOUTHWASH: 15.0000 mL | Freq: Four times a day (QID) | ORAL | Status: DC | PRN

## 2021-11-03 MED ORDER — HYDROMORPHONE HCL 1 MG/ML IJ SOLN
0.5000 mg | INTRAMUSCULAR | Status: DC | PRN
  Administered 2021-11-03: 1 mg via INTRAVENOUS
  Administered 2021-11-03 (×2): 2 mg via INTRAVENOUS
  Filled 2021-11-03 (×2): qty 2
  Filled 2021-11-03: qty 1

## 2021-11-03 MED ORDER — ENALAPRILAT 1.25 MG/ML IV SOLN: 0.6250 mg | Freq: Four times a day (QID) | INTRAVENOUS | Status: DC | PRN

## 2021-11-03 MED ORDER — LIP MEDEX EX OINT
TOPICAL_OINTMENT | Freq: Two times a day (BID) | CUTANEOUS | Status: DC
  Filled 2021-11-03: qty 7

## 2021-11-03 MED ORDER — BISACODYL 10 MG RE SUPP
10.0000 mg | Freq: Every day | RECTAL | Status: DC
  Administered 2021-11-03: 10 mg via RECTAL
  Filled 2021-11-03: qty 1

## 2021-11-03 MED ORDER — KETOROLAC TROMETHAMINE 30 MG/ML IJ SOLN
15.0000 mg | Freq: Once | INTRAMUSCULAR | Status: AC
  Administered 2021-11-03: 15 mg via INTRAVENOUS
  Filled 2021-11-03: qty 1

## 2021-11-03 MED ORDER — PHENOL 1.4 % MT LIQD: 2.0000 | OROMUCOSAL | Status: DC | PRN

## 2021-11-03 MED ORDER — ONDANSETRON HCL 4 MG/2ML IJ SOLN
4.0000 mg | Freq: Four times a day (QID) | INTRAMUSCULAR | Status: DC | PRN
  Administered 2021-11-03: 4 mg via INTRAVENOUS
  Filled 2021-11-03: qty 2

## 2021-11-03 MED ORDER — MENTHOL 3 MG MT LOZG: 1.0000 | LOZENGE | OROMUCOSAL | Status: DC | PRN

## 2021-11-03 MED ORDER — ALUM & MAG HYDROXIDE-SIMETH 200-200-20 MG/5ML PO SUSP: 30.0000 mL | Freq: Four times a day (QID) | ORAL | Status: DC | PRN

## 2021-11-03 MED ORDER — DIPHENHYDRAMINE HCL 50 MG/ML IJ SOLN: 12.5000 mg | Freq: Four times a day (QID) | INTRAMUSCULAR | Status: DC | PRN

## 2021-11-03 MED ORDER — METOPROLOL TARTRATE 5 MG/5ML IV SOLN: 5.0000 mg | Freq: Four times a day (QID) | INTRAVENOUS | Status: DC | PRN

## 2021-11-03 MED ORDER — DIATRIZOATE MEGLUMINE & SODIUM 66-10 % PO SOLN
90.0000 mL | Freq: Once | ORAL | Status: AC
  Administered 2021-11-03: 90 mL via NASOGASTRIC
  Filled 2021-11-03: qty 90

## 2021-11-03 MED ORDER — HYDRALAZINE HCL 20 MG/ML IJ SOLN
10.0000 mg | Freq: Once | INTRAMUSCULAR | Status: AC
  Administered 2021-11-03: 10 mg via INTRAVENOUS

## 2021-11-03 MED ORDER — METHOCARBAMOL 1000 MG/10ML IJ SOLN
500.0000 mg | Freq: Three times a day (TID) | INTRAVENOUS | Status: DC
  Administered 2021-11-03: 500 mg via INTRAVENOUS
  Filled 2021-11-03: qty 500

## 2021-11-03 MED ORDER — PROCHLORPERAZINE EDISYLATE 10 MG/2ML IJ SOLN: 5.0000 mg | INTRAMUSCULAR | Status: DC | PRN

## 2021-11-03 NOTE — Progress Notes (Signed)
Patient left AMA at 1900. He said he felt better after having a BM and wanted to go home and to work in the morning. I messaged Dr. Kerry Hough who advised that the patient wait to speak to surgery in the morning because of his symptoms. I explained to Robert Howe that unless he stayed, he would have to leave AMA. He understood the ramifications and signed the paper which is in his chart.

## 2021-11-03 NOTE — Progress Notes (Signed)
Robert Howe [id number] 07/27/1965  CARE TEAM:  PCP: Patient, No Pcp Per  Outpatient Care Team: Patient Care Team: Patient, No Pcp Per as PCP - General (General Practice)  Inpatient Treatment Team: Treatment Team: Attending Provider: Erick Blinks, MD; Consulting Physician: Montez Morita, Md, MD; Rounding Team: Lilyan Gilford, MD; Charge Nurse: Ivan Anchors, RN; Technician: Misty Stanley, NT; Pharmacist: Herby Abraham, [facility]; Utilization Review: Carolynn Comment, RN; Social Worker: Anselm Pancoast, [place]   Problem List:   Principal Problem:   SBO (small bowel obstruction) (HCC) Active Problems:   HTN (hypertension)      * No surgery found *      Assessment  Persistent small bowel obstruction.  Third recurrence in 2 years.  Transition zone appears to be due to small bowel adhesions to the anterior midline abdominal wall, most likely near to a corner of mesh  History of what sounds like incisional hernia repair with mesh in [facility] in [place] several years ago.  No hernia recurrence.  [facility] Stay = 1 days)  Plan:  I went over the standard of care which is nasogastric tube decompression and small bowel protocol.  He has a tendency to refuse NG tube and try and leave early.  I cautioned against that.  I explained the natural history of bowel obstructions that can lead to dehydration, kidney failure, bowel distention and perforation/ischemia if totally unchecked..  I worry with the THIRD recurrence in only 2 years that the need for lysis of adhesions and abdominal exploration is high.  He is not in shock nor peritonitis, so I did not press the issue today.  He again does not want NG tube.  I think I convinced him to at least consider swallowing the oral contrast for the small bowel protocol.  If contrast does not get to the colon in 48-72 hours, and noted that the onset or the bowel obstruction will not open without abdominal exploration.    I think he is gone  from telling the nurse he is going to leave this morning to reconsidering and taking our advice.  He is pondering my medical advice.   -VTE prophylaxis- SCDs, etc  -mobilize as tolerated to help recovery.  I started recommend he get out walking the hallways to help his bowels open up.      I reviewed nursing notes, hospitalist notes, last 24 h vitals and pain scores, last 48 h intake and output, last 24 h labs and trends, and last 24 h imaging results. I have reviewed this patient's available data, including medical history, events of note, test results, etc as part of my evaluation.  A significant portion of that time was spent in counseling.  Care during the described time interval was provided by me.  This care required moderate level of medical decision making.  11/03/2021    Subjective: (Chief complaint)  Complaining of a lot of crampy abdominal pain.  Wanting pain medications often through the night.  Some retching and vomiting.  No flatus.  Complains that he had a "bad experience" with the nasogastric tube.  Had it pulled out.  He did not think it did anything anyway.  Sitting in his bed.  Has walked in the room but did not know if it was okay to walk in the hallways.  Objective:  Vital signs:  Vitals:   11/03/21 0121 11/03/21 0212 11/03/21 0502 11/03/21 0914  BP: (!) 194/91 (!) 165/84 (!) 145/75 (!) 156/93  Pulse: 72 79 70 62  Resp: 18 18 18 17   Temp: 98.4 F (36.9 C)  98.6 F (37 C) 98.4 F (36.9 C)  TempSrc: Oral  Oral Oral  SpO2: 94% 92% 92% 94%    Last BM Date : 10/31/21  Intake/Output   Yesterday:  08/20 0701 - 08/21 0700 In: 1000.1 [I.V.:0.1; IV Piggyback:1000] Out: 650 [Urine:650] This shift:  No intake/output data recorded.  Bowel function:  Flatus: No  BM:  No  Drain: (No drain)   Physical Exam:  General: Pt awake/alert in no acute distress Eyes: PERRL, normal EOM.  Sclera clear.  No icterus Neuro: CN II-XII intact w/o focal  sensory/motor deficits. Lymph: No head/neck/groin lymphadenopathy Psych:  No delerium/psychosis/paranoia.  Oriented x 4 HENT: Normocephalic, Mucus membranes moist.  No thrush Neck: Supple, No tracheal deviation.  No obvious thyromegaly Chest: No pain to chest wall compression.  Good respiratory excursion.  No audible wheezing CV:  Pulses intact.  Regular rhythm.  No major extremity edema MS: Normal AROM mjr joints.  No obvious deformity  Abdomen: Somewhat firm.  Very distended.   Mild discomfort right periumbilical but no guarding. .  No evidence of peritonitis.  Midline incision.  Ext:   No deformity.  No mjr edema.  No cyanosis Skin: No petechiae / purpurea.  No major sores.  Warm and dry    Results:   Cultures: No results found for this or any previous visit (from the past 720 hour(s)).  Labs: Results for orders placed or performed during the hospital encounter of 11/02/21 (from the past 48 hour(s))  CBC with Differential     Status: Abnormal   Collection Time: 11/02/21  5:00 PM  Result Value Ref Range   WBC 4.2 4.0 - 10.5 K/uL   RBC 4.90 4.22 - 5.81 MIL/uL   Hemoglobin 16.8 13.0 - 17.0 g/dL   HCT 11/04/21 40.9 - 73.5 %   MCV 101.4 (H) 80.0 - 100.0 fL   MCH 34.3 (H) 26.0 - 34.0 pg   MCHC 33.8 30.0 - 36.0 g/dL   RDW 32.9 92.4 - 26.8 %   Platelets 232 150 - 400 K/uL   nRBC 0.0 0.0 - 0.2 %   Neutrophils Relative % 59 %   Neutro Abs 2.5 1.7 - 7.7 K/uL   Lymphocytes Relative 32 %   Lymphs Abs 1.3 0.7 - 4.0 K/uL   Monocytes Relative 9 %   Monocytes Absolute 0.4 0.1 - 1.0 K/uL   Eosinophils Relative 0 %   Eosinophils Absolute 0.0 0.0 - 0.5 K/uL   Basophils Relative 0 %   Basophils Absolute 0.0 0.0 - 0.1 K/uL   Immature Granulocytes 0 %   Abs Immature Granulocytes 0.01 0.00 - 0.07 K/uL    Comment: Performed at [facility], [address], [place], [place]  Comprehensive metabolic panel     Status: Abnormal   Collection Time: 11/02/21  5:00 PM   Result Value Ref Range   Sodium 141 135 - 145 mmol/L   Potassium 4.0 3.5 - 5.1 mmol/L   Chloride 107 98 - 111 mmol/L   CO2 24 22 - 32 mmol/L   Glucose, Bld 114 (H) 70 - 99 mg/dL    Comment: Glucose reference range applies only to samples taken after fasting for at least 8 hours.   BUN 7 6 - 20 mg/dL   Creatinine, Ser 11/04/21 0.61 - 1.24 mg/dL   Calcium 9.7 8.9 - 9.79 mg/dL   Total  Protein 9.1 (H) 6.5 - 8.1 g/dL   Albumin 4.3 3.5 - 5.0 g/dL   AST 29 15 - 41 U/L   ALT 55 (H) 0 - 44 U/L   Alkaline Phosphatase 52 38 - 126 U/L   Total Bilirubin 1.4 (H) 0.3 - 1.2 mg/dL   GFR, Estimated >47 >42 mL/min    Comment: (NOTE) Calculated using the CKD-EPI Creatinine Equation (2021)    Anion gap 10 5 - 15    Comment: Performed at [facility], [address], [place], [place] [address]  Lipase, blood     Status: None   Collection Time: 11/02/21  5:00 PM  Result Value Ref Range   Lipase 30 11 - 51 U/L    Comment: Performed at [facility], [address], [place], [place] [address]  Urinalysis, Routine w reflex microscopic Urine, Clean Catch     Status: Abnormal   Collection Time: 11/02/21  8:35 PM  Result Value Ref Range   Color, Urine YELLOW YELLOW   APPearance CLEAR CLEAR   Specific Gravity, Urine 1.035 (H) 1.005 - 1.030   pH 7.0 5.0 - 8.0   Glucose, UA NEGATIVE NEGATIVE mg/dL   Hgb urine dipstick NEGATIVE NEGATIVE   Bilirubin Urine NEGATIVE NEGATIVE   Ketones, ur NEGATIVE NEGATIVE mg/dL   Protein, ur 332 (A) NEGATIVE mg/dL   Nitrite NEGATIVE NEGATIVE   Leukocytes,Ua NEGATIVE NEGATIVE   RBC / HPF 0-5 0 - 5 RBC/hpf   WBC, UA 0-5 0 - 5 WBC/hpf   Bacteria, UA NONE SEEN NONE SEEN   Squamous Epithelial / LPF 0-5 0 - 5   Mucus PRESENT     Comment: Performed at [facility], [address], [place], [place] [address]  Basic metabolic panel     Status: Abnormal   Collection Time: 11/03/21  4:23 AM  Result Value Ref Range   Sodium  144 135 - 145 mmol/L   Potassium 3.7 3.5 - 5.1 mmol/L   Chloride 110 98 - 111 mmol/L   CO2 25 22 - 32 mmol/L   Glucose, Bld 161 (H) 70 - 99 mg/dL    Comment: Glucose reference range applies only to samples taken after fasting for at least 8 hours.   BUN 10 6 - 20 mg/dL   Creatinine, Ser 4.16 0.61 - 1.24 mg/dL   Calcium 9.0 8.9 - 60.6 mg/dL   GFR, Estimated >30 >16 mL/min    Comment: (NOTE) Calculated using the CKD-EPI Creatinine Equation (2021)    Anion gap 9 5 - 15    Comment: Performed at [facility], [address] [place], [place] [address]    Imaging / Studies: CT Abdomen Pelvis W Contrast  Result Date: 11/02/2021 CLINICAL DATA:  Bowel obstruction suspected. Abdominal pain, nausea, vomiting EXAM: CT ABDOMEN AND PELVIS WITH CONTRAST TECHNIQUE: Multidetector CT imaging of the abdomen and pelvis was performed using the standard protocol following bolus administration of intravenous contrast. RADIATION DOSE REDUCTION: This exam was performed according to the departmental dose-optimization program which includes automated exposure control, adjustment of the mA and/or kV according to patient size and/or use of iterative reconstruction technique. CONTRAST:  OMNIPAQUE IOHEXOL 300 MG/ML  SOLN COMPARISON:  08/16/2021 FINDINGS: Lower chest: Small right posterior diaphragmatic hernia containing fat. No acute abnormality. Hepatobiliary: Diffuse low-density throughout the liver compatible with fatty infiltration. No focal abnormality. Gallbladder unremarkable. Pancreas: No focal abnormality or ductal dilatation. Spleen: No focal abnormality.  Normal size. Adrenals/Urinary  Tract: Small scattered renal cysts bilaterally are stable since prior study. No follow-up imaging recommended. No stones or hydronephrosis. Adrenal glands and urinary bladder unremarkable. Stomach/Bowel: Normal appendix. Prior ventral hernia repair. There are abnormal dilated small bowel loops anteriorly in the  abdomen underlying the ventral hernia repair site, some of which are fluid-filled and others contain fecal material. Distal small bowel is decompressed findings compatible with small bowel obstruction. Exact point of transition or cause not visualized. Findings are similar to prior study. Vascular/Lymphatic: No evidence of aneurysm or adenopathy. Reproductive: No visible focal abnormality. Other: No free fluid or free air. Musculoskeletal: No acute bony abnormality. IMPRESSION: Prior ventral hernia pair. There are abnormal dilated small bowel loops anteriorly under the ventral hernia repair likely adherent to the anterior abdominal wall. Findings are similar to prior study and most compatible with small bowel obstruction. Fatty liver. Electronically Signed   By: Charlett Nose M.D.   On: 11/02/2021 19:34    Medications / Allergies: per chart  Antibiotics: Anti-infectives (From admission, onward)    None         Note: Portions of this report may have been transcribed using voice recognition software. Every effort was made to ensure accuracy; however, inadvertent computerized transcription errors may be present.   Any transcriptional errors that result from this process are unintentional.    Ardeth Sportsman, MD, FACS, MASCRS Esophageal, Gastrointestinal & Colorectal Surgery Robotic and Minimally Invasive Surgery  [facility] [address] [place], [place] [address] [phone] Fax [phone] Main  CONTACT INFORMATION:  Weekday (9AM-5PM): Call [facility] main office at [phone]  Weeknight (5PM-9AM) or Weekend/Holiday: Check [email or web address] (password " [email or web address]") for General Surgery [facility] coverage  (Please, do not use SecureChat as it is not reliable communication to reach operating surgeons for immediate patient care)      11/03/2021  9:44 AM

## 2021-11-03 NOTE — Discharge Summary (Signed)
Physician Discharge Summary  Gerritt Galentine YQM:[id number] DOB: 05/18/1965 DOA: 11/02/2021  PCP: Patient, No Pcp Per  Admit date: 11/02/2021 Discharge date: 11/03/2021  PATIENT LEFT THE HOSPITAL AGAINST MEDICAL ADVICE  Brief/Interim Summary: 56 year old male with a history of prior abdominal surgeries, admitted to the hospital with small bowel obstruction.  He refused NG tube placement on admission.  Seen by general surgery.  Some concern was brought up due to his prior/recurrent small bowel obstructions, he may have a component of small bowel adhesive disease and may need operative intervention.  Patient did drink Gastrografin for small bowel protocol study, but did not wait to undergo any abdominal imaging after drinking contrast.  He did have a bowel movement and subsequently insisted on leaving the hospital.  It had been explained to him the risks of untreated bowel obstruction including progressive distention, perforation and death.  Patient reported he understood these risks and decided to leave the hospital.  Discharge Diagnoses:  Principal Problem:   SBO (small bowel obstruction) (HCC) Active Problems:   HTN (hypertension)      Allergies  Allergen Reactions   Penicillins Anaphylaxis and Hives   Ibuprofen Other (See Comments)    Consultations: General surgery   Procedures/Studies: CT Abdomen Pelvis W Contrast  Result Date: 11/02/2021 CLINICAL DATA:  Bowel obstruction suspected. Abdominal pain, nausea, vomiting EXAM: CT ABDOMEN AND PELVIS WITH CONTRAST TECHNIQUE: Multidetector CT imaging of the abdomen and pelvis was performed using the standard protocol following bolus administration of intravenous contrast. RADIATION DOSE REDUCTION: This exam was performed according to the departmental dose-optimization program which includes automated exposure control, adjustment of the mA and/or kV according to patient size and/or use of iterative reconstruction technique. CONTRAST:   OMNIPAQUE IOHEXOL 300 MG/ML  SOLN COMPARISON:  08/16/2021 FINDINGS: Lower chest: Small right posterior diaphragmatic hernia containing fat. No acute abnormality. Hepatobiliary: Diffuse low-density throughout the liver compatible with fatty infiltration. No focal abnormality. Gallbladder unremarkable. Pancreas: No focal abnormality or ductal dilatation. Spleen: No focal abnormality.  Normal size. Adrenals/Urinary Tract: Small scattered renal cysts bilaterally are stable since prior study. No follow-up imaging recommended. No stones or hydronephrosis. Adrenal glands and urinary bladder unremarkable. Stomach/Bowel: Normal appendix. Prior ventral hernia repair. There are abnormal dilated small bowel loops anteriorly in the abdomen underlying the ventral hernia repair site, some of which are fluid-filled and others contain fecal material. Distal small bowel is decompressed findings compatible with small bowel obstruction. Exact point of transition or cause not visualized. Findings are similar to prior study. Vascular/Lymphatic: No evidence of aneurysm or adenopathy. Reproductive: No visible focal abnormality. Other: No free fluid or free air. Musculoskeletal: No acute bony abnormality. IMPRESSION: Prior ventral hernia pair. There are abnormal dilated small bowel loops anteriorly under the ventral hernia repair likely adherent to the anterior abdominal wall. Findings are similar to prior study and most compatible with small bowel obstruction. Fatty liver. Electronically Signed   By: Charlett Nose M.D.   On: 11/02/2021 19:34        The results of significant diagnostics from this hospitalization (including imaging, microbiology, ancillary and laboratory) are listed below for reference.     Microbiology: No results found for this or any previous visit (from the past 240 hour(s)).   Labs: BNP (last 3 results) No results for input(s): "BNP" in the last 8760 hours. Basic Metabolic Panel: Recent Labs  Lab  11/02/21 1700 11/03/21 0423  NA 141 144  K 4.0 3.7  CL 107 110  CO2 24 25  GLUCOSE 114* 161*  BUN 7 10  CREATININE 0.79 0.81  CALCIUM 9.7 9.0   Liver Function Tests: Recent Labs  Lab 11/02/21 1700  AST 29  ALT 55*  ALKPHOS 52  BILITOT 1.4*  PROT 9.1*  ALBUMIN 4.3   Recent Labs  Lab 11/02/21 1700  LIPASE 30   No results for input(s): "AMMONIA" in the last 168 hours. CBC: Recent Labs  Lab 11/02/21 1700  WBC 4.2  NEUTROABS 2.5  HGB 16.8  HCT 49.7  MCV 101.4*  PLT 232   Cardiac Enzymes: No results for input(s): "CKTOTAL", "CKMB", "CKMBINDEX", "TROPONINI" in the last 168 hours. BNP: Invalid input(s): "POCBNP" CBG: No results for input(s): "GLUCAP" in the last 168 hours. D-Dimer No results for input(s): "DDIMER" in the last 72 hours. Hgb A1c No results for input(s): "HGBA1C" in the last 72 hours. Lipid Profile No results for input(s): "CHOL", "HDL", "LDLCALC", "TRIG", "CHOLHDL", "LDLDIRECT" in the last 72 hours. Thyroid function studies No results for input(s): "TSH", "T4TOTAL", "T3FREE", "THYROIDAB" in the last 72 hours.  Invalid input(s): "FREET3" Anemia work up No results for input(s): "VITAMINB12", "FOLATE", "FERRITIN", "TIBC", "IRON", "RETICCTPCT" in the last 72 hours. Urinalysis    Component Value Date/Time   COLORURINE YELLOW 11/02/2021 2035   APPEARANCEUR CLEAR 11/02/2021 2035   LABSPEC 1.035 (H) 11/02/2021 2035   PHURINE 7.0 11/02/2021 2035   GLUCOSEU NEGATIVE 11/02/2021 2035   HGBUR NEGATIVE 11/02/2021 2035   BILIRUBINUR NEGATIVE 11/02/2021 2035   KETONESUR NEGATIVE 11/02/2021 2035   PROTEINUR 100 (A) 11/02/2021 2035   NITRITE NEGATIVE 11/02/2021 2035   LEUKOCYTESUR NEGATIVE 11/02/2021 2035   Sepsis Labs Recent Labs  Lab 11/02/21 1700  WBC 4.2   Microbiology No results found for this or any previous visit (from the past 240 hour(s)).   Time coordinating discharge:  SIGNED:   Erick Blinks, MD  [facility] 11/03/2021, 8:22 PM   If 7PM-7AM, please contact night-coverage [email or web address]

## 2021-11-03 NOTE — Progress Notes (Signed)
PROGRESS NOTE    Robert Howe  KGU:[id number] DOB: [birth year] DOA: 11/02/2021 PCP: Patient, No Pcp Per    Brief Narrative:  56 year old male with a prior history of abdominal surgery, admitted with small bowel obstruction.  Refusing NG tube placement.  General surgery following.  Hopefully this should resolve with conservative measures.   Assessment & Plan:   Principal Problem:   SBO (small bowel obstruction) (HCC) Active Problems:   HTN (hypertension)   Recurrent small bowel obstruction -Concern for underlying small bowel adhesive disease -Patient has refused NG tube placement -General surgery following -He has tried Gastrografin for small bowel protocol study -Hopefully this will resolve conservatively -Continue supportive measures with antiemetics, IV fluids -He has been encouraged to ambulate  Hypertension -Resume oral meds when able -Continue IV as needed meds for now   DVT prophylaxis: SCDs Start: 11/02/21 2052  Code Status: Full code Family Communication: Discussed with patient Disposition Plan: Status is: Inpatient Remains inpatient appropriate because: Continued management of bowel obstruction     Consultants:  General surgery  Procedures:    Antimicrobials:      Subjective: Reports that he was able to pass some gas this morning, but has not had a bowel movement as of yet.  He did have some vomiting earlier this morning.  Objective: Vitals:   11/03/21 0212 11/03/21 0502 11/03/21 0914 11/03/21 1310  BP: (!) 165/84 (!) 145/75 (!) 156/93 (!) 198/99  Pulse: 79 70 62 65  Resp: 18 18 17 18   Temp:  98.6 F (37 C) 98.4 F (36.9 C) 98.2 F (36.8 C)  TempSrc:  Oral Oral Oral  SpO2: 92% 92% 94% 96%    Intake/Output Summary (Last 24 hours) at 11/03/2021 1345 Last data filed at 11/03/2021 1000 Gross per 24 hour  Intake 1000.09 ml  Output 850 ml  Net 150.09 ml   There were no vitals filed for this visit.  Examination:  General exam:  Appears calm and comfortable  Respiratory system: Clear to auscultation. Respiratory effort normal. Cardiovascular system: S1 & S2 heard, RRR. No JVD, murmurs, rubs, gallops or clicks. No pedal edema. Gastrointestinal system: Abdomen is distended, soft and nontender. No organomegaly or masses felt.  Sluggish bowel sounds heard. Central nervous system: Alert and oriented. No focal neurological deficits. Extremities: Symmetric 5 x 5 power. Skin: No rashes, lesions or ulcers Psychiatry: Judgement and insight appear normal. Mood & affect appropriate.     Data Reviewed: I have personally reviewed following labs and imaging studies  CBC: Recent Labs  Lab 11/02/21 1700  WBC 4.2  NEUTROABS 2.5  HGB 16.8  HCT 49.7  MCV 101.4*  PLT 232   Basic Metabolic Panel: Recent Labs  Lab 11/02/21 1700 11/03/21 0423  NA 141 144  K 4.0 3.7  CL 107 110  CO2 24 25  GLUCOSE 114* 161*  BUN 7 10  CREATININE 0.79 0.81  CALCIUM 9.7 9.0   GFR: CrCl cannot be calculated (Unknown ideal weight.). Liver Function Tests: Recent Labs  Lab 11/02/21 1700  AST 29  ALT 55*  ALKPHOS 52  BILITOT 1.4*  PROT 9.1*  ALBUMIN 4.3   Recent Labs  Lab 11/02/21 1700  LIPASE 30   No results for input(s): "AMMONIA" in the last 168 hours. Coagulation Profile: No results for input(s): "INR", "PROTIME" in the last 168 hours. Cardiac Enzymes: No results for input(s): "CKTOTAL", "CKMB", "CKMBINDEX", "TROPONINI" in the last 168 hours. BNP (last 3 results) No results for input(s): "PROBNP" in the last 8760  hours. HbA1C: No results for input(s): "HGBA1C" in the last 72 hours. CBG: No results for input(s): "GLUCAP" in the last 168 hours. Lipid Profile: No results for input(s): "CHOL", "HDL", "LDLCALC", "TRIG", "CHOLHDL", "LDLDIRECT" in the last 72 hours. Thyroid Function Tests: No results for input(s): "TSH", "T4TOTAL", "FREET4", "T3FREE", "THYROIDAB" in the last 72 hours. Anemia Panel: No results for  input(s): "VITAMINB12", "FOLATE", "FERRITIN", "TIBC", "IRON", "RETICCTPCT" in the last 72 hours. Sepsis Labs: No results for input(s): "PROCALCITON", "LATICACIDVEN" in the last 168 hours.  No results found for this or any previous visit (from the past 240 hour(s)).       Radiology Studies: CT Abdomen Pelvis W Contrast  Result Date: 11/02/2021 CLINICAL DATA:  Bowel obstruction suspected. Abdominal pain, nausea, vomiting EXAM: CT ABDOMEN AND PELVIS WITH CONTRAST TECHNIQUE: Multidetector CT imaging of the abdomen and pelvis was performed using the standard protocol following bolus administration of intravenous contrast. RADIATION DOSE REDUCTION: This exam was performed according to the departmental dose-optimization program which includes automated exposure control, adjustment of the mA and/or kV according to patient size and/or use of iterative reconstruction technique. CONTRAST:  OMNIPAQUE IOHEXOL 300 MG/ML  SOLN COMPARISON:  08/16/2021 FINDINGS: Lower chest: Small right posterior diaphragmatic hernia containing fat. No acute abnormality. Hepatobiliary: Diffuse low-density throughout the liver compatible with fatty infiltration. No focal abnormality. Gallbladder unremarkable. Pancreas: No focal abnormality or ductal dilatation. Spleen: No focal abnormality.  Normal size. Adrenals/Urinary Tract: Small scattered renal cysts bilaterally are stable since prior study. No follow-up imaging recommended. No stones or hydronephrosis. Adrenal glands and urinary bladder unremarkable. Stomach/Bowel: Normal appendix. Prior ventral hernia repair. There are abnormal dilated small bowel loops anteriorly in the abdomen underlying the ventral hernia repair site, some of which are fluid-filled and others contain fecal material. Distal small bowel is decompressed findings compatible with small bowel obstruction. Exact point of transition or cause not visualized. Findings are similar to prior study. Vascular/Lymphatic:  No evidence of aneurysm or adenopathy. Reproductive: No visible focal abnormality. Other: No free fluid or free air. Musculoskeletal: No acute bony abnormality. IMPRESSION: Prior ventral hernia pair. There are abnormal dilated small bowel loops anteriorly under the ventral hernia repair likely adherent to the anterior abdominal wall. Findings are similar to prior study and most compatible with small bowel obstruction. Fatty liver. Electronically Signed   By: Charlett Nose M.D.   On: 11/02/2021 19:34        Scheduled Meds:  bisacodyl  10 mg Rectal Daily   lip balm   Topical BID   simethicone  80 mg Oral QID   Continuous Infusions:  0.45 % NaCl with KCl 20 mEq / L 75 mL/hr at 11/03/21 1323   lactated ringers     methocarbamol (ROBAXIN) IV 500 mg (11/03/21 1331)   ondansetron (ZOFRAN) IV       LOS: 1 day    Time spent:    Erick Blinks, MD [facility]   If 7PM-7AM, please contact night-coverage [email or web address]  11/03/2021, 1:45 PM

## 2021-11-03 NOTE — Progress Notes (Signed)
Patient requesting frequent pain medication. On-call notified. Patient was educated and was offered NG tube placement, he continues to refuse.

## 2022-07-07 IMAGING — CT CT ABD-PELV W/ CM
2 of 5 series · 16 of 46 positions shown, 18 images · IV contrast (APPLIED)
Comparison: 08/23/2019

CLINICAL DATA: Abdominal distension

EXAM:
CT ABDOMEN AND PELVIS WITH CONTRAST
TECHNIQUE: Multidetector CT imaging of the abdomen and pelvis was performed
using the standard protocol following bolus administration of
intravenous contrast.
CONTRAST:  100mL OMNIPAQUE IOHEXOL 300 MG/ML  SOLN

[Series 3: abdomen 5.0 · axial · 0.87mm/px · z∈[-374,+6]mm · 13 of 88 slices shown, 15 images]
[im 6/88  soft-tissue]
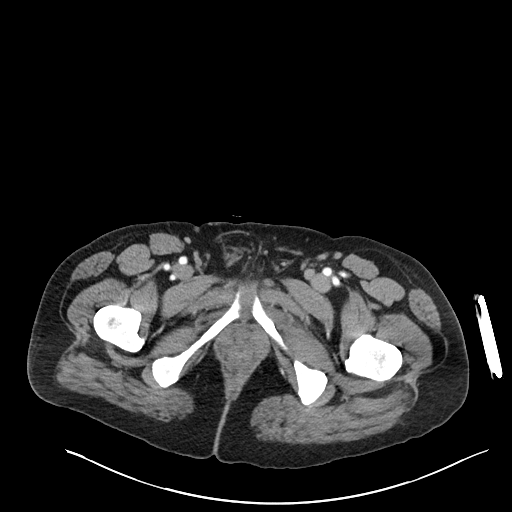
[im 6/88  bone]
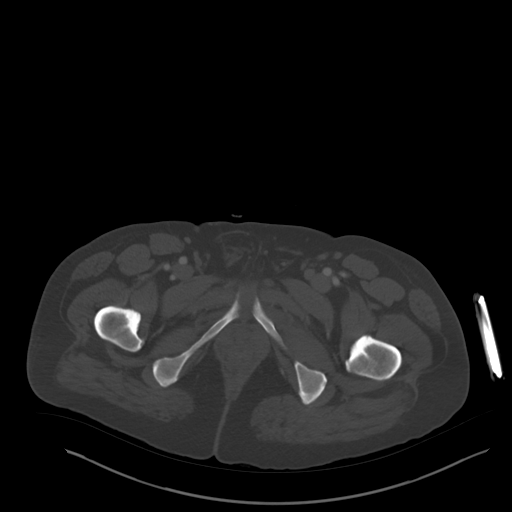
[im 11/88  soft-tissue]
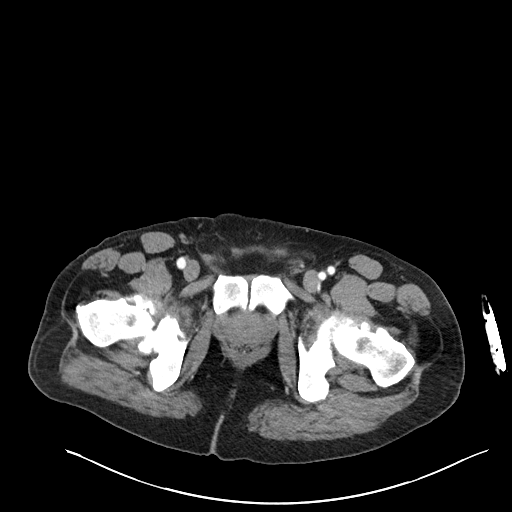
[im 17/88  soft-tissue]
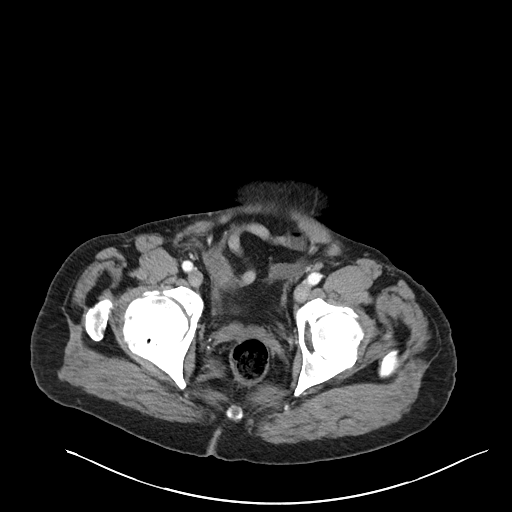
[im 28/88  soft-tissue]
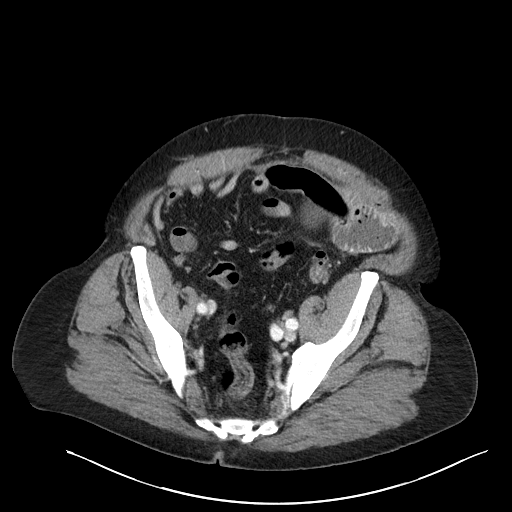
[im 33/88  soft-tissue]
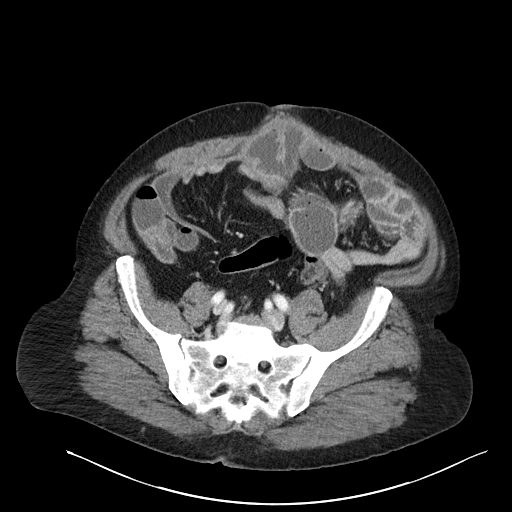
[im 39/88  soft-tissue]
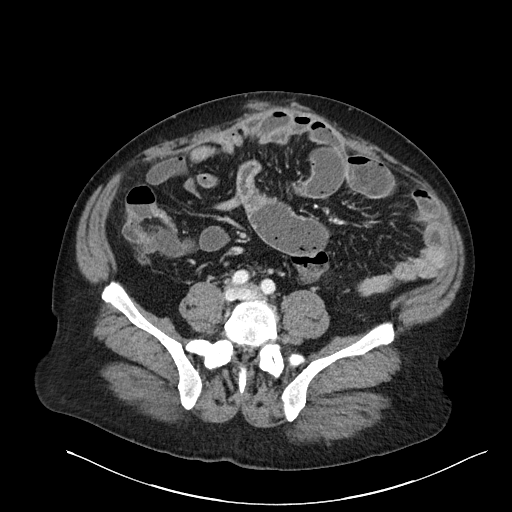
[im 44/88  soft-tissue]
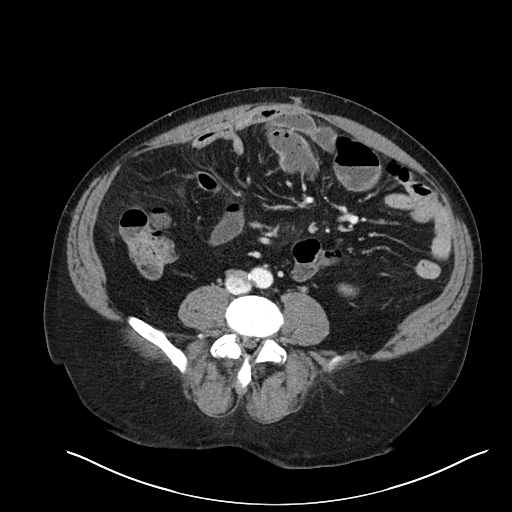
[im 49/88  soft-tissue]
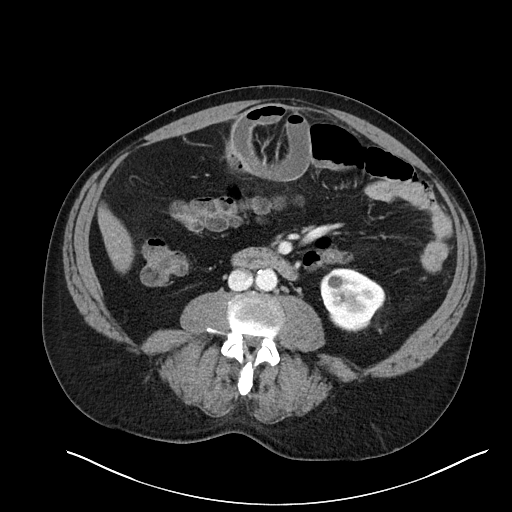
[im 55/88  soft-tissue]
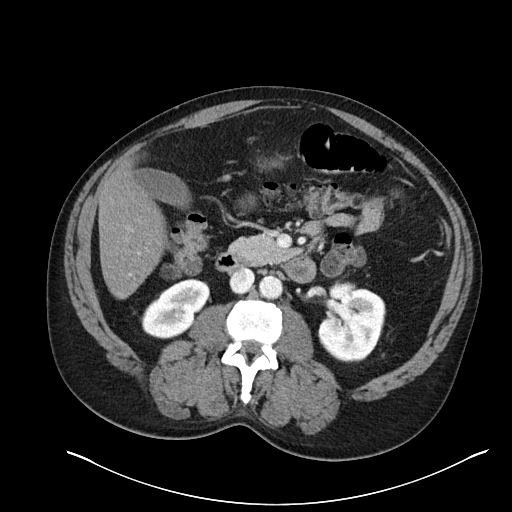
[im 55/88  bone]
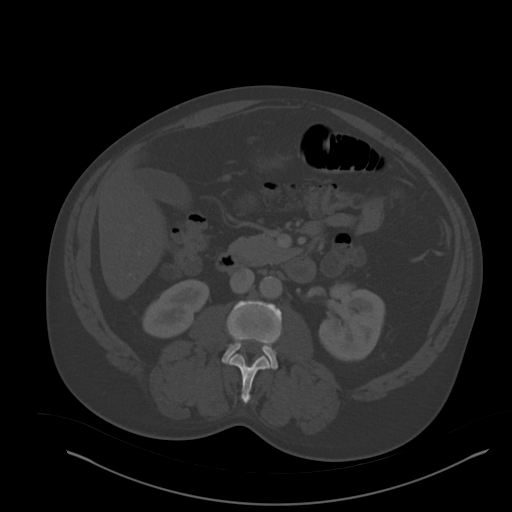
[im 60/88  soft-tissue]
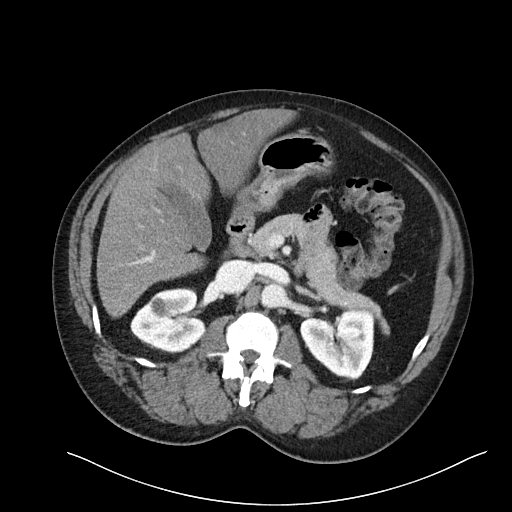
[im 71/88  soft-tissue]
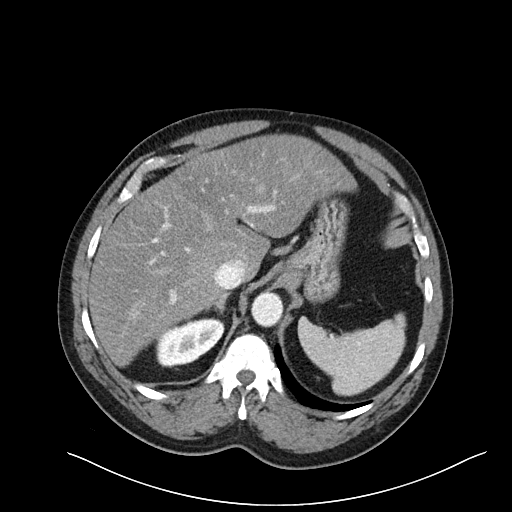
[im 77/88  soft-tissue]
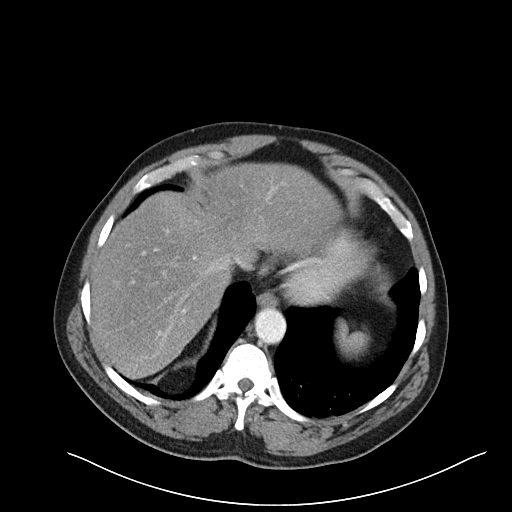
[im 82/88  soft-tissue]
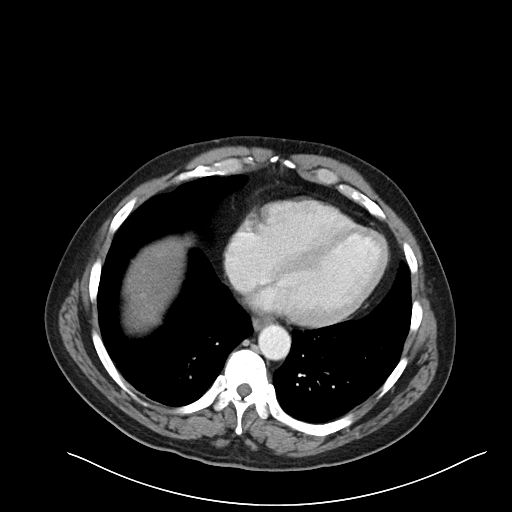

[Series 6: abdomen 3.0 mpr cor · coronal · 0.79mm/px · 3 of 113 slices shown]
[im 38/113  soft-tissue]
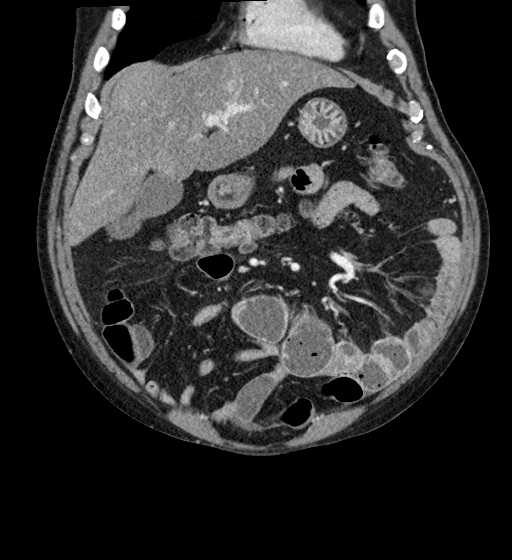
[im 50/113  soft-tissue]
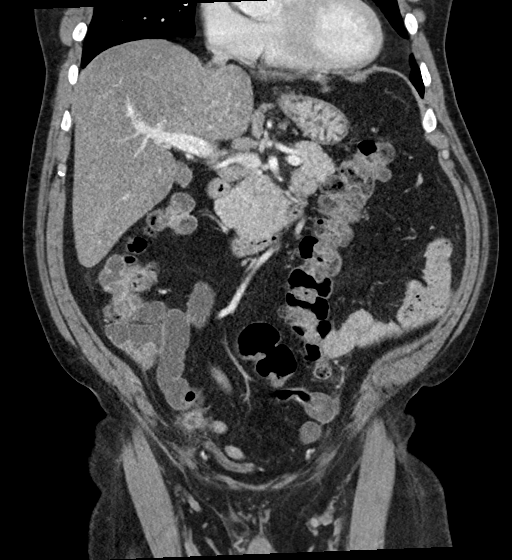
[im 63/113  soft-tissue]
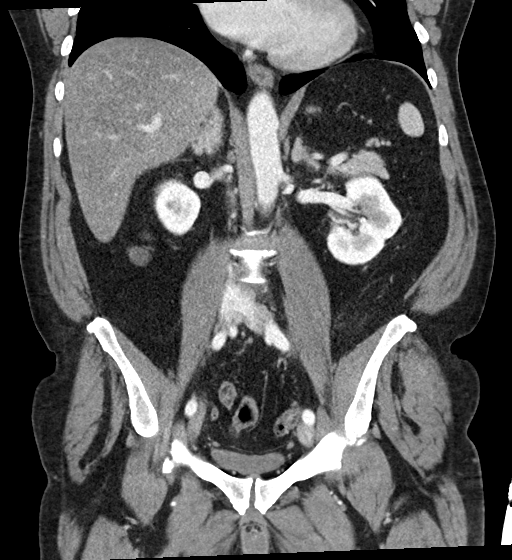

[16 of 46 positions shown; findings below may reference images not displayed]

FINDINGS: Lower chest: Stable fat containing Bochdalek's hernia on the right.

Hepatobiliary: Fatty infiltration of the liver is noted. The
gallbladder is unremarkable.

Pancreas: Unremarkable. No pancreatic ductal dilatation or
surrounding inflammatory changes.

Spleen: Normal in size without focal abnormality.

Adrenals/Urinary Tract: Adrenal glands are within normal limits.
Kidneys demonstrate cystic change bilaterally with normal excretion
of contrast material. No renal calculi are seen. No obstructive
changes are noted. The bladder is decompressed.

Stomach/Bowel: Appendix is within normal limits. The colon is
predominately decompressed. Multifocal areas of small bowel
dilatation with some fecalization of bowel contents is noted. The
loops appears somewhat matted to the anterior abdominal wall likely
related to adhesions from prior hernia repair. The more distal small
bowel is within normal limits.

Vascular/Lymphatic: Aortic atherosclerosis. No enlarged abdominal or
pelvic lymph nodes.

Reproductive: Prostate is unremarkable.

Other: No abdominal wall hernia or abnormality. No abdominopelvic
ascites.

Musculoskeletal: Degenerative changes of the hip joints are noted.
No acute bony abnormality is seen.
IMPRESSION: Changes consistent with partial small bowel obstruction with
dilatation of distal jejunal and proximal ileal loops related to
adhesions along the anterior abdominal wall from prior hernia
repair. No recurrent herniation is noted.

Chronic changes as described above.

## 2022-07-15 ENCOUNTER — Other Ambulatory Visit: Payer: Self-pay

## 2022-07-15 ENCOUNTER — Telehealth: Payer: Self-pay

## 2022-07-15 DIAGNOSIS — I8393 Asymptomatic varicose veins of bilateral lower extremities: Secondary | ICD-10-CM

## 2022-07-15 NOTE — Telephone Encounter (Signed)
Pt called with c/o of 2 incidents of bleeding from his LLE vein. He describes it as a varicose vein. He was at work the first time and then in the shower the second time. One of the times, EMS was called. Pt has been scheduled for a LE Reflux study and to see APP.

## 2022-07-16 ENCOUNTER — Other Ambulatory Visit: Payer: Self-pay | Admitting: *Deleted

## 2022-07-16 DIAGNOSIS — I8393 Asymptomatic varicose veins of bilateral lower extremities: Secondary | ICD-10-CM

## 2022-07-18 ENCOUNTER — Encounter (HOSPITAL_COMMUNITY): Payer: Self-pay

## 2022-07-18 ENCOUNTER — Other Ambulatory Visit: Payer: Self-pay

## 2022-07-18 ENCOUNTER — Observation Stay (HOSPITAL_COMMUNITY)
Admission: EM | Admit: 2022-07-18 | Discharge: 2022-07-19 | Disposition: A | Discharge status: Home / Self Care | Payer: BLUE CROSS/BLUE SHIELD | Attending: Internal Medicine | Admitting: Internal Medicine

## 2022-07-18 DIAGNOSIS — I83899 Varicose veins of unspecified lower extremities with other complications: Secondary | ICD-10-CM

## 2022-07-18 DIAGNOSIS — I739 Peripheral vascular disease, unspecified: Secondary | ICD-10-CM | POA: Insufficient documentation

## 2022-07-18 DIAGNOSIS — I959 Hypotension, unspecified: Secondary | ICD-10-CM

## 2022-07-18 DIAGNOSIS — D62 Acute posthemorrhagic anemia: Secondary | ICD-10-CM

## 2022-07-18 DIAGNOSIS — F17200 Nicotine dependence, unspecified, uncomplicated: Secondary | ICD-10-CM | POA: Insufficient documentation

## 2022-07-18 DIAGNOSIS — F17210 Nicotine dependence, cigarettes, uncomplicated: Secondary | ICD-10-CM

## 2022-07-18 DIAGNOSIS — X58XXXA Exposure to other specified factors, initial encounter: Secondary | ICD-10-CM | POA: Diagnosis not present

## 2022-07-18 DIAGNOSIS — I10 Essential (primary) hypertension: Secondary | ICD-10-CM | POA: Diagnosis not present

## 2022-07-18 DIAGNOSIS — R9431 Abnormal electrocardiogram [ECG] [EKG]: Secondary | ICD-10-CM | POA: Insufficient documentation

## 2022-07-18 DIAGNOSIS — M1990 Unspecified osteoarthritis, unspecified site: Secondary | ICD-10-CM

## 2022-07-18 DIAGNOSIS — I83892 Varicose veins of left lower extremities with other complications: Secondary | ICD-10-CM | POA: Diagnosis not present

## 2022-07-18 DIAGNOSIS — F1090 Alcohol use, unspecified, uncomplicated: Secondary | ICD-10-CM

## 2022-07-18 DIAGNOSIS — S85912A Laceration of unspecified blood vessel at lower leg level, left leg, initial encounter: Secondary | ICD-10-CM | POA: Diagnosis present

## 2022-07-18 HISTORY — DX: Essential (primary) hypertension: I10

## 2022-07-18 LAB — BASIC METABOLIC PANEL
Anion gap: 14 (ref 5–15)
BUN: 16 mg/dL (ref 6–20)
CO2: 20 mmol/L — ABNORMAL LOW (ref 22–32)
Calcium: 8.9 mg/dL (ref 8.9–10.3)
Chloride: 104 mmol/L (ref 98–111)
Creatinine, Ser: 1 mg/dL (ref 0.61–1.24)
GFR, Estimated: >60 mL/min (ref >60)
Glucose, Bld: 189 mg/dL — ABNORMAL HIGH (ref 70–99)
Potassium: 3.9 mmol/L (ref 3.5–5.1)
Sodium: 138 mmol/L (ref 135–145)

## 2022-07-18 LAB — TYPE AND SCREEN: Antibody Screen: NEGATIVE

## 2022-07-18 LAB — BPAM RBC: Unit Type and Rh: 5100

## 2022-07-18 LAB — I-STAT CHEM 8, ED
BUN: 16 mg/dL (ref 6–20)
Calcium, Ion: 1.12 mmol/L — ABNORMAL LOW (ref 1.15–1.40)
Chloride: 108 mmol/L (ref 98–111)
Creatinine, Ser: 1.1 mg/dL (ref 0.61–1.24)
Glucose, Bld: 184 mg/dL — ABNORMAL HIGH (ref 70–99)
HCT: 41 % (ref 39.0–52.0)
Hemoglobin: 13.9 g/dL (ref 13.0–17.0)
Potassium: 3.9 mmol/L (ref 3.5–5.1)
Sodium: 140 mmol/L (ref 135–145)
TCO2: 19 mmol/L — ABNORMAL LOW (ref 22–32)

## 2022-07-18 LAB — CBC
HCT: 39.3 % (ref 39.0–52.0)
Hemoglobin: 12.7 g/dL — ABNORMAL LOW (ref 13.0–17.0)
MCH: 34.1 pg — ABNORMAL HIGH (ref 26.0–34.0)
MCHC: 32.3 g/dL (ref 30.0–36.0)
MCV: 105.6 fL — ABNORMAL HIGH (ref 80.0–100.0)
Platelets: 196 10*3/uL (ref 150–400)
RBC: 3.72 MIL/uL — ABNORMAL LOW (ref 4.22–5.81)
RDW: 13.2 % (ref 11.5–15.5)
WBC: 5 10*3/uL (ref 4.0–10.5)
nRBC: 0 % (ref 0.0–0.2)

## 2022-07-18 LAB — PREPARE RBC (CROSSMATCH)

## 2022-07-18 LAB — ABO/RH: ABO/RH(D): O POS

## 2022-07-18 LAB — PROTIME-INR
INR: 1.1 (ref 0.8–1.2)
Prothrombin Time: 14 seconds (ref 11.4–15.2)

## 2022-07-18 MED ORDER — OXYCODONE HCL 5 MG PO TABS
5.0000 mg | ORAL_TABLET | Freq: Once | ORAL | Status: AC
  Administered 2022-07-18: 5 mg via ORAL
  Filled 2022-07-18: qty 1

## 2022-07-18 MED ORDER — NEBIVOLOL HCL 10 MG PO TABS
10.0000 mg | ORAL_TABLET | Freq: Every day | ORAL | Status: DC
  Administered 2022-07-19: 10 mg via ORAL
  Filled 2022-07-18: qty 1

## 2022-07-18 MED ORDER — ADULT MULTIVITAMIN W/MINERALS CH
1.0000 | ORAL_TABLET | Freq: Every day | ORAL | Status: DC
  Administered 2022-07-18 – 2022-07-19 (×2): 1 via ORAL
  Filled 2022-07-18 (×2): qty 1

## 2022-07-18 MED ORDER — NICOTINE 7 MG/24HR TD PT24: 7.0000 mg | MEDICATED_PATCH | Freq: Every day | TRANSDERMAL | Status: DC | PRN

## 2022-07-18 MED ORDER — SENNOSIDES-DOCUSATE SODIUM 8.6-50 MG PO TABS: 1.0000 | ORAL_TABLET | Freq: Every day | ORAL | Status: DC | PRN

## 2022-07-18 MED ORDER — SODIUM CHLORIDE 0.9% IV SOLUTION: Freq: Once | INTRAVENOUS | Status: DC

## 2022-07-18 MED ORDER — THIAMINE HCL 100 MG/ML IJ SOLN
100.0000 mg | Freq: Every day | INTRAMUSCULAR | Status: DC
  Filled 2022-07-18: qty 2

## 2022-07-18 MED ORDER — ACETAMINOPHEN 325 MG PO TABS
650.0000 mg | ORAL_TABLET | Freq: Four times a day (QID) | ORAL | Status: DC | PRN
  Administered 2022-07-19: 650 mg via ORAL
  Filled 2022-07-18 (×2): qty 2

## 2022-07-18 MED ORDER — LIDOCAINE-EPINEPHRINE (PF) 2 %-1:200000 IJ SOLN
INTRAMUSCULAR | Status: AC
  Administered 2022-07-18: 20 mL
  Filled 2022-07-18: qty 20

## 2022-07-18 MED ORDER — ACETAMINOPHEN 650 MG RE SUPP: 650.0000 mg | Freq: Four times a day (QID) | RECTAL | Status: DC | PRN

## 2022-07-18 MED ORDER — MORPHINE SULFATE (PF) 2 MG/ML IV SOLN
2.0000 mg | Freq: Once | INTRAVENOUS | Status: AC
  Administered 2022-07-18: 2 mg via INTRAVENOUS
  Filled 2022-07-18: qty 1

## 2022-07-18 MED ORDER — ENOXAPARIN SODIUM 40 MG/0.4ML IJ SOSY
40.0000 mg | PREFILLED_SYRINGE | INTRAMUSCULAR | Status: DC
  Administered 2022-07-18: 40 mg via SUBCUTANEOUS
  Filled 2022-07-18: qty 0.4

## 2022-07-18 MED ORDER — OXYCODONE HCL 5 MG PO TABS
5.0000 mg | ORAL_TABLET | Freq: Once | ORAL | Status: AC
  Administered 2022-07-19: 5 mg via ORAL
  Filled 2022-07-18: qty 1

## 2022-07-18 MED ORDER — FOLIC ACID 1 MG PO TABS
1.0000 mg | ORAL_TABLET | Freq: Every day | ORAL | Status: DC
  Administered 2022-07-18 – 2022-07-19 (×2): 1 mg via ORAL
  Filled 2022-07-18 (×2): qty 1

## 2022-07-18 MED ORDER — THIAMINE MONONITRATE 100 MG PO TABS
100.0000 mg | ORAL_TABLET | Freq: Every day | ORAL | Status: DC
  Administered 2022-07-18 – 2022-07-19 (×2): 100 mg via ORAL
  Filled 2022-07-18 (×2): qty 1

## 2022-07-18 NOTE — H&P (Cosign Needed)
Date: 07/18/2022               Patient Name:  Robert Howe MRN: [id number]  DOB: June 07, 1965 Age / Sex: 57 y.o., male   PCP: Dr. Parke Simmers         Medical Service: Internal Medicine Teaching Service         Attending Physician: Dr. Reymundo Poll, MD    First Contact: Dr. Gaylyn Cheers Bitha Fauteux Pager: [phone]  Second Contact: Dr. Elza Rafter Pager: [phone]       After Hours (After 5p/  First Contact Pager: [phone]  weekends / holidays): Second Contact Pager: [phone]   Chief Complaint: bleeding from LLE   History of Present Illness:  Robert Howe is a 57 y/o male with past medical history of HTN, SBO, abdominal hernia s/p repair, OA, and tobacco use disorder that presents for bleeding from LLE.   While at home today, patient noted profuse bleeding from his left anterior shin while in the bathroom.  When he stood up, he developed generalized weakness, lightheadedness, and "dark vision", but did not fall or lose consciousness.  EMS was called.  Upon arrival, patient was noted to be hypotensive.  EMS had difficulty placing an IV requiring IO access.  Of note, patient experienced a similar episode of bleeding from the same area last week while at work.  EMS was called at this time, and he was noted to have bleeding from varicose veins. The bleeding was controlled and he was not brought to the hospital at that time.  He then experienced a similar episode 2 days ago while showering.  He was able to control the bleeding at that time.  He did not come to the hospital at that time.  However, prior to these episodes, the patient has never experienced bleeding like this previously.  Patient reports taking naproxen daily for his hip pain. Denies taking aspirin, BC powder, ibuprofen, or other blood thinners.  Denies taking any other medications regularly other than Nebivolol.   Patient has otherwise been feeling well and denies fever, chills, cough, ST, nausea, vomiting, diarrhea, melena,  hematochezia, abdominal pain, constipation, chest pain, palpitations, SOB.   Review of Systems: A complete ROS was negative except as per HPI.   ED Course:  Interventions: Hypotensive upon arrival. Gave 2 units PRBCs. Bleeding controlled with 1 suture. IV morphine was given for pain.  Past Medical History:  Diagnosis Date   Hypertension    Meds:  -OTC naproxen Current Meds  Medication Sig   nebivolol (BYSTOLIC) 10 MG tablet Take 10 mg by mouth daily.    Allergies: Allergies as of 07/18/2022 - Review Complete 07/18/2022  Allergen Reaction Noted   Penicillins Hives 07/18/2022    Surgical History: abdominal hernia repair  Family History: DM (mother, sister), HTN (father), multiple myeloma (father), pacemaker (father). Denies family hx of MI or stroke.   Social History: Currently lives by himself. Has social support of mother and sister. Works at [employer], on his feet for most of the day. Independent of all ADLs/IADLs. Ambulates w/o assistance at baseline.  Drinks 0.5 pint per day of hard alcohol 5/7 days a week for 1-2 years.  Has been smoking 0.5 pack/day since his teenage years. 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  Physical Exam: Blood pressure 122/80, pulse 79, temperature 97.6 F (36.4 C), temperature source Oral, resp. rate 17, SpO2  97 %. Constitutional: appears anxious, not in acute distress, pleasant HENT: Normocephalic and atraumatic.  Eyes: EOMI. PERRL.  Neck: Normal range of motion.  Cardiovascular: Regular rate, regular rhythm. No murmurs, rubs, or gallops. Normal radial and PT pulses bilaterally. No LE edema.  Pulmonary: Normal respiratory effort. No wheezes, rales, rhonchi, or crackles.   Abdominal: Soft. Non-distended. No tenderness. Normal bowel sounds. Healed surgical scar on mid-abdomen.  Musculoskeletal: Normal range of motion.     Neurological: Alert and  oriented to person, place, and time. Non-focal. Skin: warm and dry. Varicose veins of bilateral lower extremities. Left mid-shin wrapped in gauze without active bleeding. Significant amount of dried blood on the left lower extremity. IO on right anterior shin.   EKG: personally reviewed my interpretation is sinus rhythm, left atrial enlargement, LAD, prolonged QT.   Assessment & Plan by Problem: Principal Problem:   Acute blood loss anemia   Robert Howe is a 57 y/o male with past medical history of HTN, SBO, abdominal hernia s/p repair, OA, and tobacco use disorder that presents for bleeding from LLE and was admitted for acute blood loss anemia.   #Acute blood loss anemia #LLE bleeding Patient has no prior history of bleeding episodes. Has had recurrent bleeding from varicose veins over the last week.  Was hypotensive on admission in the setting of blood loss. Hemoglobin was stable at 13.9.  S/p 2 units PRBCs.  Hemoglobin most recently 12.7.  PT/INR WNL.  Bleeding was controlled in the ED with 1 suture and bandage. On exam, patient does have varicose veins of bilateral lower extremities. LLE is bandaged. Reports using naproxen daily at home for hip pain. Denies taking other blood thinning medications. Suspect that naproxen use may have contributed to his bleeding from his varicose veins.  Will continue to trend his BP and hemoglobin.  Will transfuse again if hemoglobin < 7. Will control his pain associated w/ IO placement. IO removed by today.   -1X dose of oxycodone 5 mg -PRN tylenol for pain -Trend CBC -Transfuse if Hgb < 7  #HTN Patient taking nebivolol 10 mg daily at home. On admission, he was hypotensive in the setting of acute blood loss. S/p 2 units pRBCs. MAP improved from 60s-90s.  Will hold nebivolol today in the setting of low-normal BP. -Start Nebivolol 10 mg tomorrow  #Prolonged QTc QTc prolonged at 501 on EKG. BMP WNL on admission.  Will trend BMP to assess for electrolyte  abnormalities that could further prolong QTc.  -Avoid QTc prolonging medications  #Hx of Abdominal Hernia s/p repair #Hx of SBO She has history of recurrent SBO in the setting of prior abdominal hernia repair.  Discharged in 08/2019 after being hospitalized for SBO.  Patient did not require surgery at that time. Currently not complaining of constipation. Last bowel movement was today. Has normal bowel sounds on exam.  Will continue to monitor for symptom development. -Monitor for symptom development  #OA Patient has history of osteoarthritis.  Taking naproxen daily at home.  Given concern for bleeding at this time, will hold off on NSAIDs. -PRN tylenol  #Tobacco Use Disorder Patient has longstanding smoking history.  Will counsel on tobacco cessation and offer nicotine replacement. -Nicotine patch PRN  # Alcohol use Patient endorses heavy alcohol use.  No signs of alcohol withdrawal at this time.  Will continue to monitor for signs of withdrawal. -CIWA w/o ativan    Diet: HH  Bowel: senna VTE: lovenox IVF: none Code: full PT/OT recs:  LOS:  day 1   Prior to Admission Living Arrangement: home by himself Anticipated Discharge Location: TBD Barriers to Discharge: continued management  Dispo: Admit patient to Observation with expected length of stay less than 2 midnights.  Signed: Karoline Caldwell, MD 07/18/2022, 4:14 PM  Pager: [phone] After 5pm on weekdays and 1pm on weekends: On Call pager: [phone]

## 2022-07-18 NOTE — Progress Notes (Signed)
RLL IO removed per order.  No bleeding at site, cleaned with CHG, vaseline guaze and dry 2x2 applied to insertion site, covered with tegaderm.  Birgit RN at bedside, aware to monitor dressing and site.

## 2022-07-18 NOTE — ED Notes (Signed)
Tourniquet removed by Dr. Rosalia Hammers

## 2022-07-18 NOTE — ED Provider Notes (Signed)
[place] EMERGENCY DEPARTMENT AT [facility] Provider Note   CSN: [id number] Arrival date & time: 07/18/22  1113     History  No chief complaint on file.   Robert Howe is a 57 y.o. male.  HPI 57 year old male presents today with bleeding from his left lower extremity and hypotension.  Patient states he had a varicosity on his left lower leg that began bleeding at work.  He took the Band-Aid off of it this morning and began having more bleeding.  EMS reports that they were third on the scene.  Fire and reported that there was blood all over the place and the patient had low blood pressure.  Tourniquet was placed by fire department.  EMS reports that his blood pressure was systolically 68.  Heart rate was 88.  He had a IV placed in his left upper arm and IO placed in his right tibia.    Home Medications Prior to Admission medications   Not on File      Allergies    Penicillins    Review of Systems   Review of Systems  Physical Exam Updated Vital Signs BP 117/60   Pulse 76   Temp 97.6 F (36.4 C) (Oral)   Resp 20   SpO2 100%  Physical Exam Vitals and nursing note reviewed.  Constitutional:      General: He is in acute distress.     Appearance: He is obese. He is ill-appearing.     Comments: Patient is diaphoretic  HENT:     Head: Normocephalic and atraumatic.     Right Ear: External ear normal.     Left Ear: External ear normal.     Nose: Nose normal.     Mouth/Throat:     Pharynx: Oropharynx is clear.  Eyes:     Extraocular Movements: Extraocular movements intact.     Pupils: Pupils are equal, round, and reactive to light.  Cardiovascular:     Rate and Rhythm: Normal rate and regular rhythm.  Pulmonary:     Effort: Pulmonary effort is normal.  Abdominal:     Palpations: Abdomen is soft.  Musculoskeletal:        General: Normal range of motion.     Cervical back: Normal range of motion.  Skin:    Capillary Refill: Capillary refill takes  more than 3 seconds.     Coloration: Skin is pale.     Comments: Pinpoint areas to left lower leg that began bleeding copiously with removal of tourniquet and pressure IO placed right lower extremity  Neurological:     General: No focal deficit present.     Mental Status: He is alert.  Psychiatric:        Mood and Affect: Mood normal.     ED Results / Procedures / Treatments   Labs (all labs ordered are listed, but only abnormal results are displayed) Labs Reviewed  CBC - Abnormal; Notable for the following components:      Result Value   RBC 3.72 (*)    Hemoglobin 12.7 (*)    MCV 105.6 (*)    MCH 34.1 (*)    All other components within normal limits  BASIC METABOLIC PANEL - Abnormal; Notable for the following components:   CO2 20 (*)    Glucose, Bld 189 (*)    All other components within normal limits  I-STAT CHEM 8, ED - Abnormal; Notable for the following components:   Glucose, Bld 184 (*)  Calcium, Ion 1.12 (*)    TCO2 19 (*)    All other components within normal limits  PROTIME-INR  TYPE AND SCREEN  PREPARE RBC (CROSSMATCH)  ABO/RH    EKG EKG Interpretation  Date/Time:  Saturday Jul 18 2022 11:49:02 EDT Ventricular Rate:  80 PR Interval:  156 QRS Duration: 96 QT Interval:  434 QTC Calculation: 501 R Axis:   -40 Text Interpretation: Sinus rhythm Consider left atrial enlargement Left axis deviation Prolonged QT interval Confirmed by Margarita Grizzle [phone]) on 07/18/2022 12:29:47 PM  Radiology No results found.  Procedures .Critical Care  Performed by: Margarita Grizzle, MD Authorized by: Margarita Grizzle, MD   Critical care provider statement:    Critical care time (minutes):  45   Critical care was necessary to treat or prevent imminent or life-threatening deterioration of the following conditions:  Shock   Critical care was time spent personally by me on the following activities:  Development of treatment plan with patient or surrogate, discussions with  consultants, evaluation of patient's response to treatment, examination of patient, ordering and review of laboratory studies, ordering and review of radiographic studies, ordering and performing treatments and interventions, pulse oximetry, re-evaluation of patient's condition and review of old charts     Medications Ordered in ED Medications  0.9 %  sodium chloride infusion (Manually program via Guardrails IV Fluids) ( Intravenous Not Given 07/18/22 1214)  lidocaine-EPINEPHrine (XYLOCAINE W/EPI) 2 %-1:200000 (PF) injection (20 mLs  Given by Other 07/18/22 1120)  morphine (PF) 2 MG/ML injection 2 mg (2 mg Intravenous Given 07/18/22 1239)    ED Course/ Medical Decision Making/ A&P                             Medical Decision Making Amount and/or Complexity of Data Reviewed Labs: ordered.  Risk Prescription drug management.   Patient with acute bleeding and hypotension.  Bleeding appears to be from pinpoint area to the left lower leg.  Bleeding was controlled with suture placement. / Patient resuscitated here with 2 units of emergency release blood.  Blood pressure eventually increased currently 117/60.  Patient's diaphoresis has resolved.  He is continue to have hemostasis. Labs reviewed and hemoglobin 12.7.  Records reviewed and no old hemoglobin in system here.  Suspect that anemia and the patient's beta-blocker contributed to hypotension and acute blood loss. Plan admission to the hospital for ongoing evaluation and treatment.  Leg will need to continue to be immobilized and elevated and and observe for any ongoing bleeding.       Final Clinical Impression(s) / ED Diagnoses Final diagnoses:  Acute blood loss anemia  Hypotension, unspecified hypotension type    Rx / DC Orders ED Discharge Orders     None         Margarita Grizzle, MD 07/18/22 1259

## 2022-07-18 NOTE — ED Triage Notes (Signed)
Pt bib ems from home; pt pulled a band aid off L lower leg at 0900 this am; varicose vein gena bleeding; large amounts of blood in home on ems arrival; pot able to crawl to FD; pt went unresponsive; in and out of consciousness with ems; bp 68/52, HR 88, 100% 15 L NRB, IO in place R tib; tourniquet applied 1037, hemorrhage slowed

## 2022-07-18 NOTE — Plan of Care (Signed)

## 2022-07-18 NOTE — ED Provider Notes (Signed)
LACERATION REPAIR Performed by: Langston Masker Authorized by: Langston Masker Consent: Verbal consent obtained. Risks and benefits: risks, benefits and alternatives were discussed Consent given by: patient Patient identity confirmed: provided demographic data Prepped and Draped in normal sterile fashion Wound explored  Laceration Location: left lower lef  Laceration Length: 2mm ulcer over blood vessel   No Foreign Bodies seen or palpated  Anesthesia: local infiltration  Local anesthetic: lidocaine 1% with epinephrine  Anesthetic total: 3 ml  Irrigation method: syringe Amount of cleaning: standard  Skin closure: suture  Number of sutures: 1  vicryl   Technique: figure 8    Patient tolerance: Patient tolerated the procedure well with no immediate complications.   Pressure dressing applied  bleeding controlled    Robert Howe 07/18/22 1302    Margarita Grizzle, MD 07/18/22 [phone]

## 2022-07-18 NOTE — ED Notes (Signed)
ED TO INPATIENT HANDOFF REPORT  ED Nurse Name and Phone #:   S Name/Age/Gender Jon-Rodney Baldonado 57 y.o. male Room/Bed: RESUSC/RESUSC  Code Status   Code Status: Full Code  Home/SNF/Other Home Patient oriented to: self, place, time, and situation Is this baseline? Yes   Triage Complete: Triage complete  Chief Complaint Acute blood loss anemia [D62]  Triage Note Pt bib ems from home; pt pulled a band aid off L lower leg at 0900 this am; varicose vein gena bleeding; large amounts of blood in home on ems arrival; pot able to crawl to FD; pt went unresponsive; in and out of consciousness with ems; bp 68/52, HR 88, 100% 15 L NRB, IO in place R tib; tourniquet applied 1037, hemorrhage slowed   Allergies Allergies  Allergen Reactions   Penicillins Hives    Level of Care/Admitting Diagnosis ED Disposition     ED Disposition  Admit   Condition  --   Comment  Hospital Area: [facility] [[id number]]  Level of Care: Telemetry Medical [104]  May place patient in observation at [facility] or [facility] if equivalent level of care is available:: No  Covid Evaluation: Asymptomatic - no recent exposure (last 10 days) testing not required  Diagnosis: Acute blood loss anemia [454098]  Admitting Physician: Reymundo Poll [[id number]]  Attending Physician: Reymundo Poll [[id number]]          B Medical/Surgery History Past Medical History:  Diagnosis Date   Hypertension    Past Surgical History:  Procedure Laterality Date   HERNIA REPAIR       A IV Location/Drains/Wounds Patient Lines/Drains/Airways Status     Active Line/Drains/Airways     Name Placement date Placement time Site Days   Peripheral IV 07/18/22 Anterior;Left Hand 07/18/22  1125  Hand  less than 1   Peripheral IV 07/18/22 Right;Posterior Hand 07/18/22  1130  Hand  less than 1   Peripheral IV 07/18/22 18 G Left;Upper Arm 07/18/22  --  Arm  less than 1            Intake/Output Last  24 hours No intake or output data in the 24 hours ending 07/18/22 1455  Labs/Imaging Results for orders placed or performed during the hospital encounter of 07/18/22 (from the past 48 hour(s))  Prepare RBC     Status: None   Collection Time: 07/18/22 11:19 AM  Result Value Ref Range   Order Confirmation      ORDER PROCESSED BY BLOOD BANK Performed at [facility], [address] [place], [place] [address]   I-stat chem 8, ED (not at [facility], [facility] or [facility])     Status: Abnormal   Collection Time: 07/18/22 11:21 AM  Result Value Ref Range   Sodium 140 135 - 145 mmol/L   Potassium 3.9 3.5 - 5.1 mmol/L   Chloride 108 98 - 111 mmol/L   BUN 16 6 - 20 mg/dL   Creatinine, Ser 8.46 0.61 - 1.24 mg/dL   Glucose, Bld 962 (H) 70 - 99 mg/dL    Comment: Glucose reference range applies only to samples taken after fasting for at least 8 hours.   Calcium, Ion 1.12 (L) 1.15 - 1.40 mmol/L   TCO2 19 (L) 22 - 32 mmol/L   Hemoglobin 13.9 13.0 - 17.0 g/dL   HCT 95.2 84.1 - 32.4 %  CBC     Status: Abnormal   Collection Time: 07/18/22 11:30 AM  Result Value Ref Range   WBC  5.0 4.0 - 10.5 K/uL   RBC 3.72 (L) 4.22 - 5.81 MIL/uL   Hemoglobin 12.7 (L) 13.0 - 17.0 g/dL   HCT 82.9 56.2 - 13.0 %   MCV 105.6 (H) 80.0 - 100.0 fL   MCH 34.1 (H) 26.0 - 34.0 pg   MCHC 32.3 30.0 - 36.0 g/dL   RDW 86.5 78.4 - 69.6 %   Platelets 196 150 - 400 K/uL   nRBC 0.0 0.0 - 0.2 %    Comment: Performed at [facility], [address] [place], [place] [address]  Basic metabolic panel     Status: Abnormal   Collection Time: 07/18/22 11:30 AM  Result Value Ref Range   Sodium 138 135 - 145 mmol/L   Potassium 3.9 3.5 - 5.1 mmol/L   Chloride 104 98 - 111 mmol/L   CO2 20 (L) 22 - 32 mmol/L   Glucose, Bld 189 (H) 70 - 99 mg/dL    Comment: Glucose reference range applies only to samples taken after fasting for at least 8 hours.   BUN 16 6 - 20 mg/dL   Creatinine, Ser 4.13 0.61 - 1.24 mg/dL   Calcium 8.9 8.9 - 24.4  mg/dL   GFR, Estimated >01 >02 mL/min    Comment: (NOTE) Calculated using the CKD-EPI Creatinine Equation (2021)    Anion gap 14 5 - 15    Comment: Performed at [facility], [address] [place], [place] [address]  Protime-INR     Status: None   Collection Time: 07/18/22 11:30 AM  Result Value Ref Range   Prothrombin Time 14.0 11.4 - 15.2 seconds   INR 1.1 0.8 - 1.2    Comment: (NOTE) INR goal varies based on device and disease states. Performed at [facility], [address] [place], [place] [address]   Type and screen [facility]     Status: None (Preliminary result)   Collection Time: 07/18/22 11:30 AM  Result Value Ref Range   ABO/RH(D) O POS    Antibody Screen NEG    Sample Expiration      07/21/2022,2359 Performed at [facility], [address], [place], [place] [address]    Unit Number [id number]    Blood Component Type RED CELLS,LR    Unit division 00    Status of Unit ISSUED    Unit tag comment VERBAL ORDERS PER DR DR.RAY    Transfusion Status OK TO TRANSFUSE    Crossmatch Result COMPATIBLE    Unit Number [id number]    Blood Component Type RED CELLS,LR    Unit division 00    Status of Unit ISSUED    Unit tag comment VERBAL ORDERS PER DR DR.RAY    Transfusion Status OK TO TRANSFUSE    Crossmatch Result COMPATIBLE   ABO/Rh     Status: None   Collection Time: 07/18/22  1:46 PM  Result Value Ref Range   ABO/RH(D)      O POS Performed at [facility], [address] [place], [place] [address]    No results found.  Pending Labs Unresulted Labs (From admission, onward)     Start     Ordered   07/19/22 0500  HIV Antibody (routine testing w rflx)  (HIV Antibody (Routine testing w reflex) panel)  Tomorrow morning,   R        07/18/22 1324   07/19/22 0500  Basic metabolic panel  Tomorrow morning,  R        07/18/22 1324   07/19/22 0500  CBC  Tomorrow morning,   R        07/18/22 1324             Vitals/Pain Today's Vitals   07/18/22 1315 07/18/22 1421 07/18/22 1423 07/18/22 1430  BP: (!) 134/99 128/78  119/76  Pulse: 77 76  78  Resp: 20 14  15   Temp:      TempSrc:      SpO2: 96% 95%  96%  PainSc:   Asleep     Isolation Precautions No active isolations  Medications Medications  0.9 %  sodium chloride infusion (Manually program via Guardrails IV Fluids) ( Intravenous Not Given 07/18/22 1214)  enoxaparin (LOVENOX) injection 40 mg (has no administration in time range)  acetaminophen (TYLENOL) tablet 650 mg (has no administration in time range)    Or  acetaminophen (TYLENOL) suppository 650 mg (has no administration in time range)  lidocaine-EPINEPHrine (XYLOCAINE W/EPI) 2 %-1:200000 (PF) injection (20 mLs  Given by Other 07/18/22 1120)  morphine (PF) 2 MG/ML injection 2 mg (2 mg Intravenous Given 07/18/22 1239)    Mobility walks with person assist (normally ambulatory, has not ambulated during hospital stay as of yet)     Focused Assessments    R Recommendations: See Admitting Provider Note  Report given to:   Additional Notes:

## 2022-07-18 NOTE — ED Notes (Signed)
Pt resting with eyes closed; respirations spontaneous, even, unlabored 

## 2022-07-18 NOTE — ED Notes (Signed)
Admitting team at bedside.

## 2022-07-18 NOTE — Hospital Course (Addendum)
Recent Discharge: -  ED findings: -BP low, controlled bleeding, 2 units pRBCs, improving BP, maybe syncopize  - BMP WNL -CBC w/ Hgb 12.7,  -PT-INR WNL -EKG w/ sinus rhythm, left atrial enlargement, LAD, prolonged QT  ED interventions:  - IV morphine, 2 units pRBCs,   PCP: none  PMH: HTN, hernia  (+) SX: left thigh bleeding, hypotension -*When was LKN* - EMS came to his job last week and noticed he had varicose veins - bled last week  - 2 days ago he was showering and noticed his leg started bleeding again and he contained it - this morning, it started bleeding again and he couldn't' contain it - called EMS - this has never happened before last week - not on any blood thinners - does not take aspirin, BC powder, ibuprofen - takes naproxen - has taken everyday this past week - felt weak  - vision going in and out? No syncope though?    (-) SX: - no fever, chills, cough, ST, nausea, vomiting, diarrhea, melena, hematochezia, abdominal pain, constipation, chest pain, palpitations, SOB, orthopnea, lower extremity swelling, dysuria, urinary frequency, headache, lightheadedness, dizziness, syncope*  Surgical Hx:   Social Hx:  -*Live with, work, ADLs, ambulates w/, ETOH, tobacco, recreational drugs* - PCP Dr: Parke Simmers on [address] [facility]) - works at [employer] in [employer] - on feet at work  - lives by himself at home - support: sister, mom - drinks alcohol 5/7 days - 0.5 pint per day (regularly for 1-2 years) - smokes 0.5 ppd x since teens  - 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

## 2022-07-19 ENCOUNTER — Encounter (HOSPITAL_COMMUNITY): Payer: BLUE CROSS/BLUE SHIELD

## 2022-07-19 ENCOUNTER — Observation Stay (HOSPITAL_COMMUNITY): Payer: BLUE CROSS/BLUE SHIELD

## 2022-07-19 DIAGNOSIS — I739 Peripheral vascular disease, unspecified: Secondary | ICD-10-CM | POA: Insufficient documentation

## 2022-07-19 DIAGNOSIS — I83899 Varicose veins of unspecified lower extremities with other complications: Secondary | ICD-10-CM

## 2022-07-19 DIAGNOSIS — I10 Essential (primary) hypertension: Secondary | ICD-10-CM | POA: Diagnosis not present

## 2022-07-19 DIAGNOSIS — R9431 Abnormal electrocardiogram [ECG] [EKG]: Secondary | ICD-10-CM

## 2022-07-19 DIAGNOSIS — D62 Acute posthemorrhagic anemia: Secondary | ICD-10-CM | POA: Diagnosis not present

## 2022-07-19 DIAGNOSIS — M1990 Unspecified osteoarthritis, unspecified site: Secondary | ICD-10-CM | POA: Diagnosis not present

## 2022-07-19 LAB — CBC
HCT: 33.3 % — ABNORMAL LOW (ref 39.0–52.0)
Hemoglobin: 11.2 g/dL — ABNORMAL LOW (ref 13.0–17.0)
MCH: 32.5 pg (ref 26.0–34.0)
MCHC: 33.6 g/dL (ref 30.0–36.0)
MCV: 96.5 fL (ref 80.0–100.0)
Platelets: 170 10*3/uL (ref 150–400)
RBC: 3.45 MIL/uL — ABNORMAL LOW (ref 4.22–5.81)
RDW: 16.7 % — ABNORMAL HIGH (ref 11.5–15.5)
WBC: 6 10*3/uL (ref 4.0–10.5)
nRBC: 0 % (ref 0.0–0.2)

## 2022-07-19 LAB — TYPE AND SCREEN: Unit division: 0

## 2022-07-19 LAB — BASIC METABOLIC PANEL
Anion gap: 6 (ref 5–15)
BUN: 19 mg/dL (ref 6–20)
CO2: 24 mmol/L (ref 22–32)
Calcium: 8.4 mg/dL — ABNORMAL LOW (ref 8.9–10.3)
Chloride: 105 mmol/L (ref 98–111)
Creatinine, Ser: 0.85 mg/dL (ref 0.61–1.24)
GFR, Estimated: >60 mL/min (ref >60)
Glucose, Bld: 127 mg/dL — ABNORMAL HIGH (ref 70–99)
Potassium: 3.9 mmol/L (ref 3.5–5.1)
Sodium: 135 mmol/L (ref 135–145)

## 2022-07-19 LAB — BPAM RBC
Blood Product Expiration Date: 202406032359
Unit Type and Rh: 5100

## 2022-07-19 LAB — LIPID PANEL
Cholesterol: 141 mg/dL (ref 0–200)
HDL: 28 mg/dL — ABNORMAL LOW (ref >40)
LDL Cholesterol: 91 mg/dL (ref 0–99)
Total CHOL/HDL Ratio: 5 RATIO
Triglycerides: 108 mg/dL (ref <150)
VLDL: 22 mg/dL (ref 0–40)

## 2022-07-19 LAB — HIV ANTIBODY (ROUTINE TESTING W REFLEX): HIV Screen 4th Generation wRfx: NONREACTIVE

## 2022-07-19 MED ORDER — ADULT MULTIVITAMIN W/MINERALS CH: 1.0000 | ORAL_TABLET | Freq: Every day | ORAL | 0 refills | Status: DC

## 2022-07-19 MED ORDER — ROSUVASTATIN CALCIUM 20 MG PO TABS: 20.0000 mg | ORAL_TABLET | Freq: Every day | ORAL | 0 refills | Status: DC

## 2022-07-19 MED ORDER — FOLIC ACID 1 MG PO TABS: 1.0000 mg | ORAL_TABLET | Freq: Every day | ORAL | 0 refills | Status: DC

## 2022-07-19 MED ORDER — OXYCODONE HCL 5 MG PO TABS
5.0000 mg | ORAL_TABLET | Freq: Four times a day (QID) | ORAL | Status: DC | PRN
  Administered 2022-07-19 (×2): 5 mg via ORAL
  Filled 2022-07-19 (×2): qty 1

## 2022-07-19 MED ORDER — IOHEXOL 350 MG/ML SOLN
75.0000 mL | Freq: Once | INTRAVENOUS | Status: AC | PRN
  Administered 2022-07-19: 75 mL via INTRAVENOUS

## 2022-07-19 MED ORDER — VITAMIN B-1 100 MG PO TABS: 100.0000 mg | ORAL_TABLET | Freq: Every day | ORAL | 0 refills | Status: DC

## 2022-07-19 NOTE — Discharge Summary (Signed)
Name: Robert Howe MRN: [id number] DOB: [birth year] 57 y.o. PCP: Patient, No Pcp Per  Date of Admission: 07/18/2022 11:13 AM Date of Discharge: 07/19/2022 Attending Physician: Reymundo Poll, MD  Discharge Diagnosis: 1. Principal Problem:   Bleeding from varicose vein Active Problems:   Acute blood loss anemia   Claudication of both lower extremities (HCC)   HTN   Prolonged Qtc   OA   Tobacco use disorder   Alcohol use  Discharge Medications: Allergies as of 07/19/2022       Reactions   Penicillins Hives        Medication List     TAKE these medications    folic acid 1 MG tablet Commonly known as: FOLVITE Take 1 tablet (1 mg total) by mouth daily. Start taking on: Jul 20, 2022   multivitamin with minerals Tabs tablet Take 1 tablet by mouth daily. Start taking on: Jul 20, 2022   nebivolol 10 MG tablet Commonly known as: BYSTOLIC Take 10 mg by mouth daily.   rosuvastatin 20 MG tablet Commonly known as: Crestor Take 1 tablet (20 mg total) by mouth daily.   thiamine 100 MG tablet Commonly known as: Vitamin B-1 Take 1 tablet (100 mg total) by mouth daily. Start taking on: Jul 20, 2022        Disposition and follow-up:   [name] was discharged from [facility] in Good condition.  At the hospital follow up visit please address:  1.    A. Acute blood loss anemia, LLE bleeding  - Bleeding from vericose vein, controlled w/ stitch, to f/u w/ vascular as outpatient   B. Bilateral LE pain   - Concern for PAD, CTA aorta/bi-femoral results pending, discharged on rosuvastatin, to f/u w/ vascular as outpatient  2.  Labs / imaging needed at time of follow-up: CBC  3.  Pending labs/ test needing follow-up: CTA aorta/bi-femoral results pending  Follow-up Appointments: - Please follow up with your primary care provider Dr. Parke Simmers (please call to schedule an appointment) Phone: [phone] Address: [address],  [place], [place] [address]   - Please follow up with vascular surgery at [facility] (please call to schedule an appointment) Phone: [phone] Address: [address] [place],  [place]  [address]  Hospital Course by problem list:  Robert Howe is a 57 y/o male with past medical history of HTN, SBO, abdominal hernia s/p repair, OA, and tobacco use disorder that presents for bleeding from LLE and was admitted for acute blood loss anemia.    #Acute blood loss anemia #LLE bleeding Presented after bleeding from LLE varicose vein. Possibly in setting of naproxen use at home. Received suture in ED. S/p 2 units PRBCs. Hgb remained stable.To f/u w/ vascular as outpatient.    #Bilateral LE pain Patient reported claudication at home.  Also reported pain of bilateral lower extremities from the hip down. Lipid panel WNL w/ exception of HDL 28, LDL 91. Patient did not want to wait for ABI. Will need ABI as outpatient. CTA aorta/bi-femoral results pending. Started on rosuvastatin at discharge. To f/u w/ vascular surgery as outpatient.    #HTN Was taking nebivolol 10 mg daily at home. Initially held due to low BP. BP normalized. Restarted Nebivolol 10 mg daily and continued at discharge.    #Prolonged QTc QTc prolonged at 501. Avoid QTc prolonging medications. Consider repeat EKG as outpatient.    #Hx of Abdominal Hernia s/p repair #Hx of SBO  Has history of recurrent SBO 2/2 prior abdominal hernia repair. No abdominal complaints throughout his hospitalization. Having regular bowel movements.    #OA Was taking naproxen daily at home. Discussed avoiding NSAIDs given bleeding on admission. Recommended tylenol for pain control.    #Tobacco Use Disorder Consider nicotine patch as outpatient.    # Alcohol use Heavy alcohol use at home. Consider reassessing his motivation for alcohol cessation as outpatient.   Discharge Exam:   BP 133/82 (BP Location: Right Arm)    Pulse 65   Temp 99 F (37.2 C) (Oral)   Resp 17   Ht 5\' 7"  (1.702 m)   Wt 96 kg   SpO2 99%   BMI 33.15 kg/m  Constitutional: resting in bed, not in acute distress  Cardiovascular: Regular rate, regular rhythm. No murmurs, rubs, or gallops. Good femoral pulse bilaterally. Diminished DP pulses bilaterally.  Pulmonary: Normal respiratory effort. No wheezes or crackles.   Musculoskeletal: Normal range of motion.     Neurological: Alert and oriented to person, place, and time. Non-focal. Skin: Varicose veins of bilateral lower extremities. Left anterior shin wound w/ stitch in place. No active bleeding. Shiny hairless lower extremities bilaterally. Skin of bilateral lower extremities is cool to the touch.   Pertinent Labs, Studies, and Procedures:     Latest Ref Rng & Units 07/19/2022    2:35 AM 07/18/2022   11:30 AM 07/18/2022   11:21 AM  CBC  WBC 4.0 - 10.5 K/uL 6.0  5.0    Hemoglobin 13.0 - 17.0 g/dL 16.1  09.6  04.5   Hematocrit 39.0 - 52.0 % 33.3  39.3  41.0   Platelets 150 - 400 K/uL 170  196         Latest Ref Rng & Units 07/19/2022    2:35 AM 07/18/2022   11:30 AM 07/18/2022   11:21 AM  BMP  Glucose 70 - 99 mg/dL 409  811  914   BUN 6 - 20 mg/dL 19  16  16    Creatinine 0.61 - 1.24 mg/dL 7.82  9.56  2.13   Sodium 135 - 145 mmol/L 135  138  140   Potassium 3.5 - 5.1 mmol/L 3.9  3.9  3.9   Chloride 98 - 111 mmol/L 105  104  108   CO2 22 - 32 mmol/L 24  20    Calcium 8.9 - 10.3 mg/dL 8.4  8.9       Discharge Instructions: Discharge Instructions     Call MD for:  extreme fatigue   Complete by: As directed    Call MD for:  persistant dizziness or light-headedness   Complete by: As directed    Call MD for:  redness, tenderness, or signs of infection (pain, swelling, redness, odor or green/yellow discharge around incision site)   Complete by: As directed    Call MD for:  severe uncontrolled pain   Complete by: As directed    Diet - low sodium heart healthy   Complete by: As  directed    Increase activity slowly   Complete by: As directed       You were hospitalized for bleeding from varicose veins.    Hospital Course: We controlled the bleeding from your left leg with a stitch.  We think that the naproxen you are taking may have contributed to this bleeding.  Please use Tylenol for pain control at home and avoid NSAIDs if possible (naproxen, ibuprofen). Please follow-up with vascular surgery regarding this bleeding.  We got imaging of the vessels in your abdomen/legs. Please follow-up with vascular surgery regarding the results of these images.  Please follow-up with your primary care doctor regarding this hospitalization and medication changes.    Medications:   Please start taking: -Crestor 20 mg daily (this is for your cholesterol and the vascular disease in your legs) -Folic acid 1 mg daily -Thiamine 100 mg daily -Multivitamin daily   Please stop taking: - Naproxen   Please continue taking: -Nebivolol 10 mg daily   Follow-up: - Please follow up with your primary care provider Dr. Parke Simmers (please call to schedule an appointment) Phone: [phone] Address: [address], [place], [place] [address]  Signed: Karoline Caldwell, MD 07/19/2022, 3:21 PM   Pager: [phone]

## 2022-07-19 NOTE — Progress Notes (Addendum)
Subjective:   No bleeding overnight.  Patient is having pain of bilateral lower extremities from his hips down.  Patient reports that this pain started since hospitalization.  Also having pain in his right lower extremity associated with IO placement.  He also reports having tightness in his calves when walking short distances at home.  Objective:  Vital signs in last 24 hours: Vitals:   07/18/22 1740 07/18/22 1800 07/18/22 2010 07/19/22 0617  BP: 122/80  (!) 101/59 (!) 148/82  Pulse: 79 80 79 67  Resp: 17  18 16   Temp: 97.6 F (36.4 C)  98.2 F (36.8 C) 98.3 F (36.8 C)  TempSrc: Oral   Oral  SpO2:   100% 98%  Weight: 96 kg     Height: 5\' 7"  (1.702 m)      Physical Exam: Constitutional: resting in bed, not in acute distress  Cardiovascular: Regular rate, regular rhythm. No murmurs, rubs, or gallops. Good femoral pulse bilaterally. Diminished DP pulses bilaterally.  Pulmonary: Normal respiratory effort. No wheezes or crackles.   Musculoskeletal: Normal range of motion.     Neurological: Alert and oriented to person, place, and time. Non-focal. Skin: Varicose veins of bilateral lower extremities. Left anterior shin wound w/ stitch in place. No active bleeding. Shiny hairless lower extremities bilaterally. Skin of bilateral lower extremities is cool to the touch.   Assessment/Plan:  Principal Problem:   Acute blood loss anemia  Robert Howe is a 57 y/o male with past medical history of HTN, SBO, abdominal hernia s/p repair, OA, and tobacco use disorder that presents for bleeding from LLE and was admitted for acute blood loss anemia.    #Acute blood loss anemia #LLE bleeding Presented after bleeding from LLE varicose vein. Received suture in ED. S/p 2 units PRBCs. Hgb stable at 11.2 this morning. No bleeding overnight. LLE wound is stitched w/o active bleeding.  Spoke with vascular surgery today, NTD with respect to resolved LLE bleeding and comfortable following up as  outpatient. -Appreciate vascular recommendations -PRN tylenol for pain -Transfuse if Hgb < 7 -Trend CBC  #Bilateral LE pain Patient reporting claudication at home.  Also reporting pain of bilateral lower extremities from the hip down today. On exam, has shiny hairless lower extremities that are cool to the touch. No prior workup for PAD.  Will order lipid panel, ABI, and CTA aorta/bi-femoral.  Spoke with vascular surgery today who recommends ABI followed by CTA if abnormal.  Vascular states given his current tobacco use, would likely not perform any procedures while hospitalized currently. Recommends starting aspirin, statin, plavix if CTA abnormal. Comfortable with follow-up as outpatient. -Appreciate vascular recommendations -Outpatient vascular follow-up -Pending lipid panel -Pending CTA/ABI   #HTN Taking nebivolol 10 mg daily at home. MAP improved to 90s this morning.  -Nebivolol 10 mg daily   #Prolonged QTc QTc prolonged at 501. BMP remains WNL.  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  Having regular bowel movements.  -Monitor for symptom development   #OA Taking naproxen daily at home.   -Hold NSAIDs for bleeding -PRN tylenol   #Tobacco Use Disorder -Counsel on tobacco cessation -Nicotine patch PRN   # Alcohol use Heavy alcohol use at home. CIWA 0 overnight.  -CIWA w/o ativan   Diet: HH  Bowel: senna VTE: lovenox IVF: none Code: full PT/OT recs:  LOS: day 2    Prior to Admission  Living Arrangement: home by himself Anticipated Discharge Location: TBD Barriers to Discharge: continued management Dispo: Anticipated discharge in approximately less than 2 day(s).   Karoline Caldwell, MD 07/19/2022, 6:40 AM Pager: [phone] After 5pm on weekdays and 1pm on weekends: On Call pager [phone]

## 2022-07-19 NOTE — Discharge Planning (Signed)
Patient has received discharge education. IVs has been removed. All questions has been asked and answered. Patient is stable and no distress noted or verbalized. Patient reports understanding.  Lawana Pai, RN

## 2022-07-19 NOTE — Discharge Instructions (Addendum)
You were hospitalized for bleeding from varicose veins.   Hospital Course: We controlled the bleeding from your left leg with a stitch.  We think that the naproxen you are taking may have contributed to this bleeding.  Please use Tylenol for pain control at home and avoid NSAIDs if possible (naproxen, ibuprofen). Please follow-up with vascular surgery regarding this bleeding.  We got imaging of the vessels in your abdomen/legs. Please follow-up with vascular surgery regarding the results of these images.  Please follow-up with your primary care doctor regarding this hospitalization and medication changes.   Medications:  Please start taking: -Crestor 20 mg daily (this is for your cholesterol and the vascular disease in your legs) -Folic acid 1 mg daily -Thiamine 100 mg daily -Multivitamin daily  Please stop taking: - Naproxen  Please continue taking: -Nebivolol 10 mg daily  Follow-up: - Please follow up with your primary care provider Dr. Parke Simmers (please call to schedule an appointment) Phone: [phone] Address: [address], [place], [place] [address]  - Please follow up with vascular surgery at [facility] (please call to schedule an appointment) Phone: [phone] Address: [address] [place],  [place]  [address]

## 2022-07-19 NOTE — Plan of Care (Signed)
Patient is resting in bed with normal vitals. All meds have been given and tolerated well. No complaint of pain at the very moment. Safety precautions are in place. Plan of care is ongoing.  Problem: Education: Goal: Knowledge of General Education information will improve Description: Including pain rating scale, medication(s)/side effects and non-pharmacologic comfort measures Outcome: Progressing   Problem: Health Behavior/Discharge Planning: Goal: Ability to manage health-related needs will improve Outcome: Progressing   Problem: Clinical Measurements: Goal: Ability to maintain clinical measurements within normal limits will improve Outcome: Progressing Goal: Will remain free from infection Outcome: Progressing Goal: Diagnostic test results will improve Outcome: Progressing Goal: Respiratory complications will improve Outcome: Progressing Goal: Cardiovascular complication will be avoided Outcome: Progressing   Problem: Activity: Goal: Risk for activity intolerance will decrease Outcome: Progressing   Problem: Nutrition: Goal: Adequate nutrition will be maintained Outcome: Progressing   Problem: Coping: Goal: Level of anxiety will decrease Outcome: Progressing   Problem: Elimination: Goal: Will not experience complications related to bowel motility Outcome: Progressing Goal: Will not experience complications related to urinary retention Outcome: Progressing   Problem: Pain Managment: Goal: General experience of comfort will improve Outcome: Progressing   Problem: Safety: Goal: Ability to remain free from injury will improve Outcome: Progressing   Problem: Skin Integrity: Goal: Risk for impaired skin integrity will decrease Outcome: Progressing

## 2022-07-20 ENCOUNTER — Other Ambulatory Visit: Payer: Self-pay

## 2022-07-20 ENCOUNTER — Telehealth: Payer: Self-pay

## 2022-07-20 ENCOUNTER — Encounter (HOSPITAL_COMMUNITY): Payer: Self-pay

## 2022-07-20 LAB — BPAM RBC
Blood Product Expiration Date: 202405222359
ISSUE DATE / TIME: 202405041120
ISSUE DATE / TIME: 202405041120

## 2022-07-20 LAB — TYPE AND SCREEN
ABO/RH(D): O POS
Unit division: 0

## 2022-07-20 MED ORDER — ROSUVASTATIN CALCIUM 20 MG PO TABS: 20.0000 mg | ORAL_TABLET | Freq: Every day | ORAL | 0 refills | Status: DC

## 2022-07-20 NOTE — Telephone Encounter (Signed)
Incoming fax from pharmacy Medication: Rosuvastatin Message to prescriber: requesting a 90 day supply

## 2022-07-21 ENCOUNTER — Ambulatory Visit (HOSPITAL_COMMUNITY)
Admission: RE | Admit: 2022-07-21 | Discharge: 2022-07-21 | Disposition: A | Discharge status: Home / Self Care | Payer: BLUE CROSS/BLUE SHIELD | Source: Ambulatory Visit | Attending: Vascular Surgery | Admitting: Vascular Surgery

## 2022-07-21 ENCOUNTER — Ambulatory Visit: Payer: BLUE CROSS/BLUE SHIELD | Admitting: Physician Assistant

## 2022-07-21 VITALS — BP 123/81 | HR 83 | Temp 98.3°F | Wt 209.0 lb

## 2022-07-21 DIAGNOSIS — I8393 Asymptomatic varicose veins of bilateral lower extremities: Secondary | ICD-10-CM | POA: Diagnosis not present

## 2022-07-21 DIAGNOSIS — I83899 Varicose veins of unspecified lower extremities with other complications: Secondary | ICD-10-CM | POA: Diagnosis not present

## 2022-07-21 DIAGNOSIS — I872 Venous insufficiency (chronic) (peripheral): Secondary | ICD-10-CM

## 2022-07-21 NOTE — Progress Notes (Signed)
Called and updated patient on results. Patient would like to switch providers and be seen in our clinic. Messaged front desk to schedule hospital follow up appointment for later this week or early next week to discuss hospitalization.

## 2022-07-21 NOTE — Progress Notes (Signed)
Office Note     CC:  follow up Requesting Provider:  No ref. provider found  HPI: Robert Howe is a 57 y.o. (1966-03-16) male who presents for evaluation of bleeding varicose veins of the left lower extremity.  He describes an episode of profuse bleeding from a varicose vein in his left lower leg on 07/18/2022.  He was found with an altered mental status in a pool of blood at the scene by paramedics.  His blood pressure was soft and he required interosseous access in his right leg and eventually transfusion of 2 units of packed red blood cells in the emergency department.  He has not had an episode of bleeding in the left leg since ER visit.  He denies any history of DVT, venous ulcerations, trauma, or prior vascular interventions.  He does not wear compression or make an effort to elevate his legs during the day.  He is an everyday smoker.  He notes that the varicosities of his left leg have been growing in size over the past several months to years.   Past Medical History:  Diagnosis Date   Hypertension     Past Surgical History:  Procedure Laterality Date   HERNIA REPAIR      Social History   Socioeconomic History   Marital status: Single    Spouse name: Not on file   Number of children: Not on file   Years of education: Not on file   Highest education level: 12th grade  Occupational History   Not on file  Tobacco Use   Smoking status: Every Day    Packs/day: 0.50    Years: 37.00    Additional pack years: 0.00    Total pack years: 18.50    Types: Cigarettes   Smokeless tobacco: Never  Vaping Use   Vaping Use: Never used  Substance and Sexual Activity   Alcohol use: Yes    Alcohol/week: 4.0 standard drinks of alcohol    Types: 1 Glasses of wine, 3 Cans of beer per week    Comment: 1/2 pint of vodka 4x/week   Drug use: Not Currently   Sexual activity: Yes  Other Topics Concern   Not on file  Social History Narrative   ** Merged History Encounter **        Social Determinants of Health   Financial Resource Strain: Not on file  Food Insecurity: No Food Insecurity (07/18/2022)   Hunger Vital Sign    Worried About Running Out of Food in the Last Year: Never true    Ran Out of Food in the Last Year: Never true  Transportation Needs: No Transportation Needs (07/18/2022)   PRAPARE - Administrator, Civil Service (Medical): No    Lack of Transportation (Non-Medical): No  Physical Activity: Not on file  Stress: Not on file  Social Connections: Not on file  Intimate Partner Violence: Not At Risk (07/18/2022)   Humiliation, Afraid, Rape, and Kick questionnaire    Fear of Current or Ex-Partner: No    Emotionally Abused: No    Physically Abused: No    Sexually Abused: No    Family History  Problem Relation Age of Onset   Hypertension Mother     Current Outpatient Medications  Medication Sig Dispense Refill   amLODipine (NORVASC) 5 MG tablet Take 5 mg by mouth daily.     folic acid (FOLVITE) 1 MG tablet Take 1 tablet (1 mg total) by mouth daily. 30 tablet 0  Multiple Vitamin (MULTIVITAMIN WITH MINERALS) TABS tablet Take 1 tablet by mouth daily. 30 tablet 0   nebivolol (BYSTOLIC) 10 MG tablet Take 10 mg by mouth daily.     rosuvastatin (CRESTOR) 20 MG tablet Take 1 tablet (20 mg total) by mouth daily. 90 tablet 0   thiamine (VITAMIN B-1) 100 MG tablet Take 1 tablet (100 mg total) by mouth daily. 30 tablet 0   No current facility-administered medications for this visit.    Allergies  Allergen Reactions   Penicillins Anaphylaxis and Hives   Ibuprofen Other (See Comments)   Penicillins Hives     REVIEW OF SYSTEMS:   [X]  denotes positive finding, [ ]  denotes negative finding Cardiac  Comments:  Chest pain or chest pressure:    Shortness of breath upon exertion:    Short of breath when lying flat:    Irregular heart rhythm:        Vascular    Pain in calf, thigh, or hip brought on by ambulation:    Pain in feet at night  that wakes you up from your sleep:     Blood clot in your veins:    Leg swelling:         Pulmonary    Oxygen at home:    Productive cough:     Wheezing:         Neurologic    Sudden weakness in arms or legs:     Sudden numbness in arms or legs:     Sudden onset of difficulty speaking or slurred speech:    Temporary loss of vision in one eye:     Problems with dizziness:         Gastrointestinal    Blood in stool:     Vomited blood:         Genitourinary    Burning when urinating:     Blood in urine:        Psychiatric    Major depression:         Hematologic    Bleeding problems:    Problems with blood clotting too easily:        Skin    Rashes or ulcers:        Constitutional    Fever or chills:      PHYSICAL EXAMINATION:  Vitals:   07/21/22 1339  BP: 123/81  Pulse: 83  Temp: 98.3 F (36.8 C)  TempSrc: Temporal  SpO2: 95%  Weight: 209 lb (94.8 kg)    General:  WDWN in NAD; vital signs documented above Gait: Not observed HENT: WNL, normocephalic Pulmonary: normal non-labored breathing , without Rales, rhonchi,  wheezing Cardiac: regular HR Abdomen: soft, NT, no masses Skin: without rashes Vascular Exam/Pulses: Symmetrical DP pulses Extremities: Dry eschar overlying bleeding varicose vein; large ropey varicosities along the medial left leg; pigmentation changes of the left medial lower leg Musculoskeletal: no muscle wasting or atrophy  Neurologic: A&O X 3 Psychiatric:  The pt has Normal affect.   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 male here for evaluation of bleeding varicose veins of the left lower extremity  -Patient experienced profound bleeding from a varicosity in his left  lower extremity requiring transfusion of 2 units of  packed red blood cells.  Today he has dry eschar over lying the area of bleeding.  He was told to keep this area covered and wear thigh-high compression 20 to 30 mmHg on a daily basis.  He should also elevate his legs periodically throughout the day.  He will try to keep this area protected is much as possible to avoid any further bleeding.  He was instructed on how to apply pressure and elevate his leg if bleeding were to recur.  He will come back to be evaluated by Dr. Randie Heinz in 3 months time for possible vein ablation therapy as well as stab phlebectomy.  Should he develop recurrent bleeding as well as a nonhealing wound in this area he may require an Radio broadcast assistant placement.   Emilie Rutter, PA-C [facility] [phone]  Clinic MD:   Chest[facility] Spore

## 2022-07-22 ENCOUNTER — Encounter: Payer: Self-pay | Admitting: Physician Assistant

## 2022-07-31 NOTE — Progress Notes (Deleted)
CC: Hospital f/u visit  HPI:  Robert Howe is a 57 y.o. male with past medical history of HTN, SBO, OA, bleeding varicose vein, acute blood loss anemia, claudication that presents for hospital f/u visit.    Allergies as of 07/31/2022       Reactions   Penicillins Anaphylaxis, Hives   Ibuprofen Other (See Comments)   Penicillins Hives        Medication List        Accurate as of Jul 31, 2022  7:59 AM. If you have any questions, ask your nurse or doctor.          amLODipine 5 MG tablet Commonly known as: NORVASC Take 5 mg by mouth daily.   folic acid 1 MG tablet Commonly known as: FOLVITE Take 1 tablet (1 mg total) by mouth daily.   multivitamin with minerals Tabs tablet Take 1 tablet by mouth daily.   nebivolol 10 MG tablet Commonly known as: BYSTOLIC Take 10 mg by mouth daily.   rosuvastatin 20 MG tablet Commonly known as: Crestor Take 1 tablet (20 mg total) by mouth daily.   thiamine 100 MG tablet Commonly known as: Vitamin B-1 Take 1 tablet (100 mg total) by mouth daily.         Past Medical History:  Diagnosis Date   Hypertension    Review of Systems:  per HPI.   Physical Exam: *** There were no vitals filed for this visit. *** Constitutional: Well-developed, well-nourished, appears comfortable  HENT: Normocephalic and atraumatic.  Eyes: EOM are normal. PERRL.  Neck: Normal range of motion.  Cardiovascular: Regular rate, regular rhythm. No murmurs, rubs, or gallops. Normal radial and PT pulses bilaterally. No LE edema.  Pulmonary: Normal respiratory effort. No wheezes, rales, or rhonchi.   Abdominal: Soft. Non-distended. No tenderness. Normal bowel sounds.  Musculoskeletal: Normal range of motion.     Neurological: Alert and oriented to person, place, and time. Non-focal. Skin: warm and dry.    Assessment & Plan:   Varicose vein bleeding, acute blood loss anemia Patient was discharged on 5/5 after being hospitalized for acute  blood loss anemia in the setting of bleeding varicose vein. Bleeding was controlled w/ a stitch in the ED and was discharged with plan to f/u w/ vascular surgery. Patient saw vascular surgery on 5/7 w/ plan to wear thigh-high compression stockings, elevated legs periodically, and to protect these areas from trauma. Plan was to be seen again in 3 months for possible vein ablation and stab phlebectomy w/ Dr. Randie Heinz. Also discussed that patient may require unna boot if he is to have recurrent bleeding. Bleeding since his hospitalization has been well controlled***.   Plan: - Repeat CBC*** - Continue compression stockings/leg elevation - Continue f/u w/ vascular surgery  2. Claudication Patient reported bilateral LE claudication during his recent hospitalization. Was also noted to have shiny, hairless lower extremities. There was concern for PAD. CTA aorta/bifemoral demonstrated mild atherosclerotic disease but patent vessels. Recent lipid panel w/ LDL 91. Patient was discharged on rosuvastatin 20 mg daily w/ plan to f/u w/ vascular as outpatient. Patient reports compliance with this medication***. Leg pain since hospitalization***.   Plan: - Continue rosuvastatin 20 mg daily*** - Continue f/u w/ vascular surgery  3. OA Patient developed *** pain *** ago that began while ***. Patient states that the pain does *** radiate and is *** in severity. Patient states that the pain worsens w/ *** and improves w/ ***. Patient denies recent injury or  increase in activity***. Patient states that they have tried *** without relief in their symptoms.    Bilateral hip OA noted on recent CT.   Plan: - ***   4. Health Screening: ***Hep C, tdap, colonoscopy  -  - Medication refill? ***   See Encounters Tab for problem based charting.  No problem-specific Assessment & Plan notes found for this encounter.    Patient seen with Dr. Amadeo Garnet

## 2022-08-16 ENCOUNTER — Other Ambulatory Visit: Payer: Self-pay

## 2022-08-18 NOTE — Telephone Encounter (Signed)
Called patient and updated on need for hospital follow up visit before medications are refilled. Patient agrees with the plan. Messaged the front desk to schedule an appointment.

## 2022-08-31 ENCOUNTER — Encounter: Payer: BLUE CROSS/BLUE SHIELD | Admitting: Student

## 2022-10-28 ENCOUNTER — Ambulatory Visit (INDEPENDENT_AMBULATORY_CARE_PROVIDER_SITE_OTHER): Payer: Self-pay | Admitting: Vascular Surgery

## 2022-10-29 ENCOUNTER — Telehealth: Payer: Self-pay | Admitting: Vascular Surgery

## 2022-12-02 ENCOUNTER — Telehealth: Payer: Self-pay | Admitting: Vascular Surgery

## 2022-12-02 ENCOUNTER — Ambulatory Visit: Payer: Self-pay | Admitting: Vascular Surgery

## 2023-03-19 NOTE — Discharge Summary (Signed)
 [facility]  [facility] Inpatient Discharge Summary  PCP: No primary care provider on file. Discharge Details   Admit date:         03/11/2023 Discharge date:        03/19/2023  Hospital Days:    8 days  Code Status:   Full Code Advanced Directives on file: No Directive        Discharge Diagnoses:  Principal Problem:   Diabetic ketoacidosis without coma associated with type 2 diabetes mellitus (*) Active Problems:   HTN (hypertension)   Hyponatremia   Hypercholesterolemia   AKI (acute kidney injury) (*)   Task list for follow-up: Follow-up with primary care physician in 3 to 5 days per   Follow-Up Appointments Suggested: Follow-up with Primary Care Physician   New diagnosis diabetes mellitus.  Follow-Up Appointments Already Scheduled: No future appointments.  Discharge Medications: Current Discharge Medication List     NEW medications   Details  Blood Glucose Monitoring Suppl (FREESTYLE FREEDOM) KIT 1 Package by Does not apply route 30 (thirty) minutes before meals. Start date: 03/19/2023    Insulin Glargine (BASAGLAR KWIKPEN) 100 Unit/mL SOPN Inject forty Units into the skin 2 (two) times a day with meals for 30 days. Start date: 03/19/2023, End date: 04/18/2023    losartan  potassium (COZAAR ) 25 mg tablet Take one tablet (25 mg dose) by mouth daily. Start date: 03/20/2023    metFORMIN ER (GLUCOPHAGE-XR) 500 mg 24 hr tablet Take one tablet (500 mg dose) by mouth with breakfast for 30 days. Start date: 03/19/2023, End date: 04/18/2023       CONTINUED medications   Details  folic acid  1 mg tablet Take one tablet (1 mg dose) by mouth daily.    Multiple Vitamins-Minerals (MULTI VITAMIN/MINERALS) TABS Take 1 tablet by mouth daily.    nebivolol  (BYSTOLIC ) 10 MG tablet Take one tablet (10 mg dose) by mouth daily.    rosuvastatin  calcium  (CRESTOR ) 20 mg tablet Take one tablet (20 mg dose) by mouth daily.    Thiamine  Mononitrate 100 MG TABS  Take one tablet (100 mg dose) by mouth daily.        Allergies: Allergies  Allergen Reactions  . Penicillins Anaphylaxis, Hives and Other  . Ibuprofen  Other and Abdominal Pain    Consultations this Admission: IP CONSULT TO [facility]ET[facility] IP CONSULT TO NUTRITION SERVICES IP CONSULT TO CASE MANAGEMENT, RN/SW IP CONSULT TO CASE MANAGEMENT, RN/SW  Procedures/Imaging:     No orders to display    Pertinent Labs:  Cardiac Labs: No results for input(s): CK, CKMB, CTNI, BNP in the last 168 hours. CBC: Recent Labs    Units 03/15/23 0406  WBC thou/mcL 4.5  HGB gm/dL 88.7*  PLT thou/mcL 794   BMP: Recent Labs    Units 03/19/23 0628 03/18/23 0345 03/15/23 0406 03/13/23 0614 03/12/23 1724  NA mmol/L 132* 136 134* 134*  --   K mmol/L 5.0 4.5 4.7 3.7  --   CL mmol/L 102 100 98 99  --   CO2 mmol/L 18* 24 24 26   --   BUN mg/dL 12 17 12 6   --   CREATININE mg/dL 9.37* 9.08 9.25* 9.42*  --   MAGNESIUM mg/dL  --   --  1.9 1.8 1.7   Lipid Panel: Recent Labs    Units 03/17/23 0614  CHOL mg/dL 879  TRIG mg/dL 98  HDL mg/dL 26*  LDL mg/dL 74   Liver Enzymes: No results for input(s): INR,  AST, ALT, ALKPHOS, BILITOT in the last 168 hours. Endocrine Panels: Recent Labs    Units 03/19/23 1143 03/19/23 0757 03/19/23 0628 03/18/23 1959 03/18/23 1610 03/18/23 1148 03/18/23 0802 03/18/23 0345 03/17/23 2100 03/17/23 1640 03/17/23 1135 03/17/23 0749  GLUCOSE mg/dL 686* 760* 721* 709* 728* 312* 224* 308* 298* 191* 324* [facility]   Physicians involved in care during this hospitalization Attending Provider: Therisa KANDICE Silvan, MD Attending Provider: Luisa Amour, DO Attending Provider: Derryl Duval, MD Attending Provider: Darleene Norleen Railing, MD Attending Provider: Candido Pebbles, MD Attending Provider: Erla Simper, MD Attending Provider: Classie Leek, MD Admitting Provider: Luisa Amour, DO Consulting  Physician: Derryl Duval, MD Consulting Physician: Darleene Norleen Railing, MD Consulting Physician: Cathlean FORBES Lanius, MD Consulting Physician: Erla Simper, MD  HPI per admitting provider: 58 y.o. male with  history of hypertension, presented to ED complaining of generalized malaise, polyuria, polydipsia and decreased appetite.  Patient stated that he lost her 30 pound and had a blurry vision.  Patient went to the [place] and blood glucose greater than 500.  He denies history of diabetes mellitus but there is a strong family history with his mom and sister.   Workup in ED showed temperature 98.4 F, pulse 84, respiratory rate 20, blood pressure 125/84, O2 sat 94% in the room air.   Lab work showed blood glucose 1015  mg/dL,  AGAP 77,ALW 25, creatinine 1 point1.33, calcium  10.3, potassium 5.3, beta hydroxybutyrate 5.1.   the patient's blood gas with pH 7.268, pCO2 55.4, PaO2 < 26.2, bicarbonate 25.   UA with urine glucose > 1000, urine ketones  40.   Hospital Course:    Progress noted 03/18/2023: 58 year old male with history of hypertension among other diagnoses who presented with polydipsia, polyuria, generalized weakness   Hyperglycemia DKA.  Resolved New diagnosis diabetes mellitus Continue glycemic protocol Dietary consult Close follow-up with primary care   Acute kidney injury stage I.  Improved Avoid nephrotoxins Encourage oral intake   Hypertension Add losartan  Vitals per floor protocol Follow-up with PCP   Fluid and electrolyte disorder: N/A, present on admission  Plan: N/A   Nutritional status: Body mass index is 26.69 kg/m. Overweight Plan: PCP follow up Dietary nutrition supplements Glucerna; Strawberry Consistent Carbohydrate Carb Consistent   Coagulation defect: N/A, present on admission Plan: N/A   Other chronic medical conditions: History of hypercholesterolemia Continue home medications where appropriate   Further Hospital course: Seen and evaluated bedside.   58 year old male with history of hypertension who presented with diabetic ketoacidosis.  No prior diagnosis of diabetes mellitus.  He was initiated on insulin regimen with improvement in his symptoms. I have recommended follow-up with primary care physician in 3 to 5 days for close monitoring and further management.  Recommendations: Discussed at bedside Take medications as prescribed.  Prescription sent to pharmacy Follow-up with primary care physician in 3 to 5 days.    Discharge diagnosis: Diabetes mellitus.  New diagnosis Diabetic acidosis AKI stage I. Hypertension Other chronic medical conditions hyperlipidemia, overweight     BP 134/72   Pulse 69   Temp 98.1 F (36.7 C) (Oral)   Resp 18   Ht 1.702 m (5' 7)   Wt 77.3 kg (170 lb 6.7 oz)   SpO2 95%   BMI 26.69 kg/m   Physical Exam Constitutional:      Appearance: Normal appearance.  Cardiovascular:     Rate and Rhythm: Normal rate.     Heart sounds:  No friction rub. No gallop.  Skin:    General: Skin is warm and dry.  Neurological:     General: No focal deficit present.     Mental Status: He is alert and oriented to person, place, and time.  Psychiatric:        Mood and Affect: Mood normal.        Thought Content: Thought content normal.     Post Hospital Care   Activity:   Weight Bearing Status:          Oxygen Orders for Discharge: O2 Device: None (Room air) SpO2: 95 %  Diet: Diet and Nourishment Orders (From admission, onward)     Start       03/12/23 1200  Dietary nutrition supplements Glucerna; Strawberry  With Lunch & Dinner       Question Answer Comment  Select Supplement Glucerna   Select Flavor Strawberry        03/12/23 1059  Consistent Carbohydrate Carb Consistent  Diet effective now       Question:  Additional restrictions:  Answer:  Carb Consistent              Wound Care Recommendations:    Lines/Drains/Airways: Patient Lines/Drains/Airways Status      Active LDAs     None            Therapy Recommendations:  PT:                  OT:            SLP:              Home Health Orders: DME Orders (From admission, onward)    None      Home Health Agency     None       I spent 34 minutes performing discharge services.   Electronically signed: Classie Leek, MD 03/19/2023 / 2:57 PM

## 2023-08-02 ENCOUNTER — Other Ambulatory Visit (HOSPITAL_BASED_OUTPATIENT_CLINIC_OR_DEPARTMENT_OTHER): Payer: Self-pay | Admitting: Surgery

## 2023-08-02 ENCOUNTER — Encounter (HOSPITAL_BASED_OUTPATIENT_CLINIC_OR_DEPARTMENT_OTHER): Payer: Self-pay | Admitting: Surgery

## 2023-08-02 DIAGNOSIS — I83892 Varicose veins of left lower extremities with other complications: Secondary | ICD-10-CM

## 2023-08-13 ENCOUNTER — Ambulatory Visit (HOSPITAL_COMMUNITY)
Admission: RE | Admit: 2023-08-13 | Discharge: 2023-08-13 | Disposition: A | Discharge status: Home / Self Care | Payer: Self-pay | Source: Ambulatory Visit | Attending: Vascular Surgery | Admitting: Vascular Surgery

## 2023-08-13 DIAGNOSIS — I83892 Varicose veins of left lower extremities with other complications: Secondary | ICD-10-CM

## 2023-09-03 ENCOUNTER — Ambulatory Visit: Payer: Self-pay | Attending: Vascular Surgery | Admitting: Physician Assistant

## 2023-09-03 VITALS — BP 148/97 | HR 81 | Temp 97.7°F | Ht 67.0 in | Wt 207.9 lb

## 2023-09-03 DIAGNOSIS — I83892 Varicose veins of left lower extremities with other complications: Secondary | ICD-10-CM

## 2023-09-03 NOTE — Progress Notes (Signed)
 VASCULAR & VEIN SPECIALISTS           OF [place]  History and Physical   Robert Howe is a 58 y.o. male who presents with LLE varicose veins with hx of bleeding.    He was seen on 07/21/2022 and had hx of recent bleeding from a varicose vein.  He was found with an altered mental status in a pool of blood at the scene by paramedics. His blood pressure was soft and he required interosseous access in his right leg and eventually transfusion of 2 units of packed red blood cells in the emergency department. He has not had an episode of bleeding in the left leg since ER visit.  He was not elevating his legs or wearing compression socks.  He was scheduled to return to see Dr. Vikki Graves in 3 months for possible vein ablation therapy as well as stab phlebectomy.   He states that he had bleeding as above.  The area that bled had improved but recently has started getting bigger and he has concerns about bleeding again.  He did show me pictures and there was quite a bit of blood present when he bled last year.  He bled once at work and a paramedic was present and was able to stop the bleeding.  Later at home, he removed the bandage and it was stuck and he bled again.    He does not have hx of DVT.  He has not had any procedures on his legs.  He does not wear compression.    He has plans for a right hip replacement in the future.   The pt is on a statin for cholesterol management.  The pt is not on a daily aspirin.   Other AC:  none The pt is on CCB, BB for hypertension.   The pt is not on medication for diabetes.   Tobacco hx:  current  Pt does not have family hx of AAA.  Past Medical History:  Diagnosis Date   Hypertension     Past Surgical History:  Procedure Laterality Date   HERNIA REPAIR      Social History   Socioeconomic History   Marital status: Single    Spouse name: Not on file   Number of children: Not on file   Years of education: Not on file   Highest  education level: 12th grade  Occupational History   Not on file  Tobacco Use   Smoking status: Every Day    Current packs/day: 0.50    Average packs/day: 0.5 packs/day for 37.0 years (18.5 ttl pk-yrs)    Types: Cigarettes   Smokeless tobacco: Never  Vaping Use   Vaping status: Never Used  Substance and Sexual Activity   Alcohol use: Yes    Alcohol/week: 4.0 standard drinks of alcohol    Types: 1 Glasses of wine, 3 Cans of beer per week    Comment: 1/2 pint of vodka 4x/week   Drug use: Not Currently   Sexual activity: Yes  Other Topics Concern   Not on file  Social History Narrative   ** Merged History Encounter **       Social Drivers of Corporate investment banker Strain: Not on file  Food Insecurity: No Food Insecurity (03/17/2023)   Received from [facility]   Hunger Vital Sign    Within the past 12 months, you worried that your food would run out before you got  the money to buy more.: Never true    Within the past 12 months, the food you bought just didn't last and you didn't have money to get more.: Never true  Transportation Needs: No Transportation Needs (03/17/2023)   Received from [facility]   PRAPARE - Transportation    Lack of Transportation (Medical): No    Lack of Transportation (Non-Medical): No  Physical Activity: Not on file  Stress: No Stress Concern Present (03/12/2023)   Received from [facility] [employer] - Occupational Stress Questionnaire    Feeling of Stress : Not at all  Social Connections: Unknown (07/29/2021)   Received from [facility]   Social Network    Social Network: Not on file  Intimate Partner Violence: Not At Risk (03/11/2023)   Received from [facility]   HITS    Over the last 12 months how often did your partner physically hurt you?: Never    Over the last 12 months how often did your partner insult you or talk down to you?: Never    Over the last 12 months how often did your partner  threaten you with physical harm?: Never    Over the last 12 months how often did your partner scream or curse at you?: Never     Family History  Problem Relation Age of Onset   Hypertension Mother     Current Outpatient Medications  Medication Sig Dispense Refill   amLODipine (NORVASC) 5 MG tablet Take 5 mg by mouth daily.     folic acid  (FOLVITE ) 1 MG tablet Take 1 tablet (1 mg total) by mouth daily. 30 tablet 0   Multiple Vitamin (MULTIVITAMIN WITH MINERALS) TABS tablet Take 1 tablet by mouth daily. 30 tablet 0   nebivolol  (BYSTOLIC ) 10 MG tablet Take 10 mg by mouth daily.     rosuvastatin  (CRESTOR ) 20 MG tablet Take 1 tablet (20 mg total) by mouth daily. 90 tablet 0   thiamine  (VITAMIN B-1) 100 MG tablet Take 1 tablet (100 mg total) by mouth daily. 30 tablet 0   No current facility-administered medications for this visit.    Allergies  Allergen Reactions   Penicillins Anaphylaxis and Hives   Ibuprofen  Other (See Comments)   Penicillins Hives    REVIEW OF SYSTEMS:   [X]  denotes positive finding, [ ]  denotes negative finding Cardiac  Comments:  Chest pain or chest pressure:    Shortness of breath upon exertion:    Short of breath when lying flat:    Irregular heart rhythm:        Vascular    Pain in calf, thigh, or hip brought on by ambulation:    Pain in feet at night that wakes you up from your sleep:     Blood clot in your veins:    Leg swelling:  x       Pulmonary    Oxygen at home:    Productive cough:     Wheezing:         Neurologic    Sudden weakness in arms or legs:     Sudden numbness in arms or legs:     Sudden onset of difficulty speaking or slurred speech:    Temporary loss of vision in one eye:     Problems with dizziness:         Gastrointestinal    Blood in stool:     Vomited blood:  Genitourinary    Burning when urinating:     Blood in urine:        Psychiatric    Major depression:         Hematologic    Bleeding problems:     Problems with blood clotting too easily:        Skin    Rashes or ulcers:        Constitutional    Fever or chills:      PHYSICAL EXAMINATION:  Today's Vitals   09/03/23 0923  BP: (!) 148/97  Pulse: 81  Temp: 97.7 F (36.5 C)  TempSrc: Temporal  Weight: 207 lb 14.4 oz (94.3 kg)  Height: 5' 7 (1.702 m)  PainSc: 8   PainLoc: Leg   Body mass index is 32.56 kg/m.   General:  WDWN in NAD; vital signs documented above Gait: Not observed HENT: WNL, normocephalic Pulmonary: normal non-labored breathing without wheezing Cardiac: regular HR; without carotid bruits Abdomen: soft, NT, aortic pulse is not palpable Skin: without rashes Vascular Exam/Pulses:  Right Left  Radial 2+ (normal) 2+ (normal)  DP 2+ (normal) 2+ (normal)   Extremities:   + varicosities left leg.  Area just below the knee that did bleed and has recently started enlarging.       Neurologic: A&O X 3;  moving all extremities equally Psychiatric:  The pt has Normal affect.   Non-Invasive Vascular Imaging:   Venous duplex on 08/13/2023: +--------------+---------+------+-----------+------------+--------+  LEFT         Reflux NoRefluxReflux TimeDiameter cmsComments                          Yes                                   +--------------+---------+------+-----------+------------+--------+  FV prox       no                                              +--------------+---------+------+-----------+------------+--------+  FV mid        no                                              +--------------+---------+------+-----------+------------+--------+  FV dist       no                                              +--------------+---------+------+-----------+------------+--------+  GSV at SFJ              yes                 0.8               +--------------+---------+------+-----------+------------+--------+  GSV prox thigh          yes                  0.74              +--------------+---------+------+-----------+------------+--------+  GSV mid thigh  yes                 0.59              +--------------+---------+------+-----------+------------+--------+  GSV dist thigh          yes                 0.56              +--------------+---------+------+-----------+------------+--------+  GSV at knee             yes                 0.66              +--------------+---------+------+-----------+------------+--------+  GSV prox calf           yes                 0.32              +--------------+---------+------+-----------+------------+--------+  GSV mid calf            yes                 0.28              +--------------+---------+------+-----------+------------+--------+  GSV dist calf no                            0.32              +--------------+---------+------+-----------+------------+--------+  SSV Pop Fossa no                            0.15              +--------------+---------+------+-----------+------------+--------+  SSV prox calf no                            0.15              +--------------+---------+------+-----------+------------+--------+  SSV mid calf  no                            0.22              +--------------+---------+------+-----------+------------+--------+     Summary:  Left:  - No evidence of deep vein thrombosis seen in the left lower extremity,  from the common femoral through the popliteal veins.  - No evidence of superficial venous reflux seen in the left short  saphenous vein.  - Venous reflux is noted in the left greater saphenous vein in the thigh.  - Venous reflux is noted in the left greater saphenous vein in the calf.  - Multiple varicosities seen in the medial and anterior portion of the  left calf.     Robert Howe is a 58 y.o. male who presents with: LLE varicose veins with hx of bleeding    -pt has easily  palpable pedal pulses bilaterally. -pt does not have evidence of DVT.  Pt does not have venous reflux in the deep left venous system.  He does have venous reflux in the left GSV at the [facility] and throughout the thigh down to mid calf.  The vein in the thigh to the knee measures 0.59cm-0.66cm.  -discussed with pt about wearing thigh high 20-30 mmHg compression stockings and pt was measured for  these today.    -discussed the importance of leg elevation and how to elevate properly - pt is advised to elevate their legs and a diagram is given to them to demonstrate for pt to lay flat on their back with knees elevated and slightly bent with their feet higher than their knees, which puts their feet higher than their heart for 15 minutes per day.  If pt cannot lay flat, advised to lay as flat as possible.  -pt is advised to continue as much walking as possible and avoid sitting or standing for long periods of time.  -discussed importance of weight loss and exercise and that water aerobics would also be beneficial.  -handout with recommendations given -pt will f/u in 3 months with Dr. Vikki Graves for further evaluation for laser ablation.  Will discuss with him and determine since he has had a bleed in the past that required intraosseous transfusion, would he have to wait 3 months.     Maryanna Smart, [facility] Vein Specialists [phone]  Clinic MD:  Susi Eric

## 2023-09-06 DIAGNOSIS — M7989 Other specified soft tissue disorders: Secondary | ICD-10-CM

## 2023-11-24 ENCOUNTER — Encounter: Payer: Self-pay | Admitting: Vascular Surgery

## 2023-11-24 ENCOUNTER — Ambulatory Visit: Attending: Vascular Surgery | Admitting: Vascular Surgery

## 2023-11-24 VITALS — BP 124/81 | HR 86 | Temp 99.1°F | Ht 67.0 in | Wt 198.0 lb

## 2023-11-24 DIAGNOSIS — I872 Venous insufficiency (chronic) (peripheral): Secondary | ICD-10-CM | POA: Diagnosis not present

## 2023-11-24 DIAGNOSIS — I83892 Varicose veins of left lower extremities with other complications: Secondary | ICD-10-CM | POA: Diagnosis not present

## 2023-11-24 NOTE — Progress Notes (Signed)
 Patient ID: Robert Howe, male   DOB: 1966/02/20, 58 y.o.   MRN: [id number]  Reason for Consult: Follow-up   Referred by No ref. provider found  Subjective:     HPI:  Robert Howe is a 58 y.o. 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  He had an episode of bleeding which required emergency department evaluation and transfusion.  Patient has been wearing compression socks has had no further bleeding events.  He denies any personal or family history of DVT and has never had any venous interventions.  He does have associated swelling in his left lower extremity  Past Medical History:  Diagnosis Date   Hypertension    Family History  Problem Relation Age of Onset   Hypertension Mother    Past Surgical History:  Procedure Laterality Date   HERNIA REPAIR      Short Social History:  Social History   Tobacco Use   Smoking status: Every Day    Current packs/day: 0.50    Average packs/day: 0.5 packs/day for 37.0 years (18.5 ttl pk-yrs)    Types: Cigarettes   Smokeless tobacco: Never  Substance Use Topics   Alcohol use: Yes    Alcohol/week: 4.0 standard drinks of alcohol    Types: 1 Glasses of wine, 3 Cans of beer per week    Comment: 1/2 pint of vodka 4x/week    Allergies  Allergen Reactions   Penicillins Anaphylaxis and Hives   Ibuprofen  Other (See Comments)   Penicillins Hives    Current Outpatient Medications  Medication Sig Dispense Refill   amLODipine (NORVASC) 5 MG tablet Take 5 mg by mouth daily.     folic acid  (FOLVITE ) 1 MG tablet Take 1 tablet (1 mg total) by mouth daily. 30 tablet 0   Multiple Vitamin (MULTIVITAMIN WITH MINERALS) TABS tablet Take 1 tablet by mouth daily. 30 tablet 0   nebivolol  (BYSTOLIC ) 10 MG tablet Take 10 mg by mouth daily.     rosuvastatin  (CRESTOR ) 20 MG tablet Take 1 tablet (20 mg total) by mouth daily. 90 tablet 0   thiamine  (VITAMIN B-1) 100 MG tablet Take 1 tablet (100 mg total) by mouth  daily. 30 tablet 0   No current facility-administered medications for this visit.    Review of Systems  Constitutional:  Constitutional negative. HENT: HENT negative.  Eyes: Eyes negative.  Cardiovascular: Positive for leg swelling.  GI: Gastrointestinal negative.  Musculoskeletal: Musculoskeletal negative.  Skin: Skin negative.  Neurological: Neurological negative. Hematologic: Positive for bruises/bleeds easily.        Objective:  Objective   Vitals:   11/24/23 1423  BP: 124/81  Pulse: 86  Temp: 99.1 F (37.3 C)  SpO2: 93%  Weight: 198 lb (89.8 kg)  Height: 5' 7 (1.702 m)   Body mass index is 31.01 kg/m.  Physical Exam HENT:     Head: Normocephalic.     Nose: Nose normal.     Mouth/Throat:     Mouth: Mucous membranes are moist.  Eyes:     Pupils: Pupils are equal, round, and reactive to light.  Cardiovascular:     Rate and Rhythm: Normal rate.  Pulmonary:     Effort: Pulmonary effort is normal.  Abdominal:     General: Abdomen is flat.  Musculoskeletal:     Right lower leg: No edema.     Left lower leg: Edema present.  Skin:    General: Skin is warm.     Capillary Refill:  Capillary refill takes less than 2 seconds.  Neurological:     General: No focal deficit present.     Mental Status: He is alert.  Psychiatric:        Mood and Affect: Mood normal.        Thought Content: Thought content normal.        Judgment: Judgment normal.        Data: LEFT          Reflux NoRefluxReflux TimeDiameter cmsComments                          Yes                                   +--------------+---------+------+-----------+------------+--------+  FV prox       no                                              +--------------+---------+------+-----------+------------+--------+  FV mid        no                                              +--------------+---------+------+-----------+------------+--------+  FV dist       no                                               +--------------+---------+------+-----------+------------+--------+  GSV at SFJ              yes                 0.8               +--------------+---------+------+-----------+------------+--------+  GSV prox thigh          yes                 0.74              +--------------+---------+------+-----------+------------+--------+  GSV mid thigh           yes                 0.59              +--------------+---------+------+-----------+------------+--------+  GSV dist thigh          yes                 0.56              +--------------+---------+------+-----------+------------+--------+  GSV at knee             yes                 0.66              +--------------+---------+------+-----------+------------+--------+  GSV prox calf           yes                 0.32              +--------------+---------+------+-----------+------------+--------+  GSV mid calf  yes                 0.28              +--------------+---------+------+-----------+------------+--------+  GSV dist calf no                            0.32              +--------------+---------+------+-----------+------------+--------+  SSV Pop Fossa no                            0.15              +--------------+---------+------+-----------+------------+--------+  SSV prox calf no                            0.15              +--------------+---------+------+-----------+------------+--------+  SSV mid calf  no                            0.22              +--------------+---------+------+-----------+------------+--------+         Summary:  Left:  - No evidence of deep vein thrombosis seen in the left lower extremity,  from the common femoral through the popliteal veins.  - No evidence of superficial venous reflux seen in the left short  saphenous vein.  - Venous reflux is noted in the left greater  saphenous vein in the thigh.  - Venous reflux is noted in the left greater saphenous vein in the calf.  - Multiple varicosities seen in the medial and anterior portion of the  left calf.      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  I evaluated his great saphenous vein at bedside today which is very large and within the fascia from the knee all the way to the saphenofemoral junction.  We have discussed the need for treatment of the underlying greater saphenous vein with laser ablation as well as stab phlebectomy between 10 and 20 sites to include the area of varicosity the previous bleed blood.  We discussed the need for continued compressive therapy perioperatively and he demonstrates good understanding.  We discussed the risks including DVT and need for follow-up duplex in 2 weeks post procedure.     Penne Lonni Colorado MD [facility]

## 2024-02-07 ENCOUNTER — Inpatient Hospital Stay (HOSPITAL_COMMUNITY)
Admission: EM | Admit: 2024-02-07 | Discharge: 2024-02-10 | DRG: 390 | Disposition: A | Discharge status: Home / Self Care | Attending: Internal Medicine | Admitting: Internal Medicine

## 2024-02-07 ENCOUNTER — Emergency Department (HOSPITAL_COMMUNITY)

## 2024-02-07 ENCOUNTER — Other Ambulatory Visit: Payer: Self-pay

## 2024-02-07 DIAGNOSIS — E669 Obesity, unspecified: Secondary | ICD-10-CM | POA: Diagnosis present

## 2024-02-07 DIAGNOSIS — F1010 Alcohol abuse, uncomplicated: Secondary | ICD-10-CM | POA: Diagnosis present

## 2024-02-07 DIAGNOSIS — Z886 Allergy status to analgesic agent status: Secondary | ICD-10-CM

## 2024-02-07 DIAGNOSIS — Z87892 Personal history of anaphylaxis: Secondary | ICD-10-CM

## 2024-02-07 DIAGNOSIS — Z8249 Family history of ischemic heart disease and other diseases of the circulatory system: Secondary | ICD-10-CM

## 2024-02-07 DIAGNOSIS — E119 Type 2 diabetes mellitus without complications: Secondary | ICD-10-CM | POA: Diagnosis present

## 2024-02-07 DIAGNOSIS — Z6832 Body mass index (BMI) 32.0-32.9, adult: Secondary | ICD-10-CM

## 2024-02-07 DIAGNOSIS — K56609 Unspecified intestinal obstruction, unspecified as to partial versus complete obstruction: Principal | ICD-10-CM | POA: Diagnosis present

## 2024-02-07 DIAGNOSIS — K5651 Intestinal adhesions [bands], with partial obstruction: Principal | ICD-10-CM | POA: Diagnosis present

## 2024-02-07 DIAGNOSIS — F17210 Nicotine dependence, cigarettes, uncomplicated: Secondary | ICD-10-CM | POA: Diagnosis present

## 2024-02-07 DIAGNOSIS — Z7985 Long-term (current) use of injectable non-insulin antidiabetic drugs: Secondary | ICD-10-CM

## 2024-02-07 DIAGNOSIS — Z79899 Other long term (current) drug therapy: Secondary | ICD-10-CM

## 2024-02-07 DIAGNOSIS — Z880 Allergy status to penicillin: Secondary | ICD-10-CM

## 2024-02-07 DIAGNOSIS — Z7984 Long term (current) use of oral hypoglycemic drugs: Secondary | ICD-10-CM

## 2024-02-07 DIAGNOSIS — I10 Essential (primary) hypertension: Secondary | ICD-10-CM | POA: Diagnosis present

## 2024-02-07 LAB — URINALYSIS, ROUTINE W REFLEX MICROSCOPIC
Bilirubin Urine: NEGATIVE
Glucose, UA: >=500 mg/dL — AB
Hgb urine dipstick: NEGATIVE
Ketones, ur: 5 mg/dL — AB
Leukocytes,Ua: NEGATIVE
Nitrite: NEGATIVE
Protein, ur: 30 mg/dL — AB
Specific Gravity, Urine: 1.033 — ABNORMAL HIGH (ref 1.005–1.030)
pH: 5 (ref 5.0–8.0)

## 2024-02-07 LAB — CBC
HCT: 50.4 % (ref 39.0–52.0)
Hemoglobin: 16.5 g/dL (ref 13.0–17.0)
MCH: 33 pg (ref 26.0–34.0)
MCHC: 32.7 g/dL (ref 30.0–36.0)
MCV: 100.8 fL — ABNORMAL HIGH (ref 80.0–100.0)
Platelets: 256 K/uL (ref 150–400)
RBC: 5 MIL/uL (ref 4.22–5.81)
RDW: 13.5 % (ref 11.5–15.5)
WBC: 6.5 K/uL (ref 4.0–10.5)
nRBC: 0 % (ref 0.0–0.2)

## 2024-02-07 LAB — COMPREHENSIVE METABOLIC PANEL WITH GFR
ALT: 34 U/L (ref 0–44)
AST: 19 U/L (ref 15–41)
Albumin: 4 g/dL (ref 3.5–5.0)
Alkaline Phosphatase: 67 U/L (ref 38–126)
Anion gap: 16 — ABNORMAL HIGH (ref 5–15)
BUN: 10 mg/dL (ref 6–20)
CO2: 22 mmol/L (ref 22–32)
Calcium: 9.4 mg/dL (ref 8.9–10.3)
Chloride: 100 mmol/L (ref 98–111)
Creatinine, Ser: 0.83 mg/dL (ref 0.61–1.24)
GFR, Estimated: >60 mL/min (ref >60)
Glucose, Bld: 106 mg/dL — ABNORMAL HIGH (ref 70–99)
Potassium: 4.3 mmol/L (ref 3.5–5.1)
Sodium: 138 mmol/L (ref 135–145)
Total Bilirubin: 1.9 mg/dL — ABNORMAL HIGH (ref 0.0–1.2)
Total Protein: 8.8 g/dL — ABNORMAL HIGH (ref 6.5–8.1)

## 2024-02-07 LAB — LIPASE, BLOOD: Lipase: 110 U/L — ABNORMAL HIGH (ref 11–51)

## 2024-02-07 MED ORDER — ONDANSETRON HCL 4 MG/2ML IJ SOLN
4.0000 mg | Freq: Once | INTRAMUSCULAR | Status: AC | PRN
  Administered 2024-02-07: 4 mg via INTRAVENOUS
  Filled 2024-02-07: qty 2

## 2024-02-07 MED ORDER — MORPHINE SULFATE (PF) 2 MG/ML IV SOLN
4.0000 mg | Freq: Once | INTRAVENOUS | Status: AC
  Administered 2024-02-07: 4 mg via INTRAVENOUS
  Filled 2024-02-07: qty 2

## 2024-02-07 MED ORDER — IOHEXOL 350 MG/ML SOLN
75.0000 mL | Freq: Once | INTRAVENOUS | Status: AC | PRN
  Administered 2024-02-07: 75 mL via INTRAVENOUS

## 2024-02-07 NOTE — ED Triage Notes (Signed)
 Patient with hx of umbilical hernia here for continued abdominal pain due to same as well as n/v. Emesis x 7 since last night. 100mcg Fentanyl given by EMS.

## 2024-02-07 NOTE — ED Provider Triage Note (Signed)
 Emergency Medicine Provider Triage Evaluation Note  Taiwan Talcott , a 58 y.o. male  was evaluated in triage.  Pt complains of severe abdominal pain.  Patient states that he has a history of abdominal hernia. Patient states that he has increased pain x 2 days with nausea and vomiting.  He is unsure if he is having complications with his hernia or if this is a GI illness.  Review of Systems  Positive: Hernia, abd pain, nausea, vomiting Negative: Fever, CP, SOB  Physical Exam  BP (!) 151/101 (BP Location: Left Arm)   Pulse (!) 101   Temp 98.1 F (36.7 C) (Oral)   Resp 16   Ht 5' 7 (1.702 m)   Wt 93 kg   SpO2 97%   BMI 32.11 kg/m  Gen:   Awake, no distress   Resp:  Normal effort  MSK:   Moves extremities without difficulty  Other:  Diffuse abdominal tenderness on palpation.  Medical Decision Making  Medically screening exam initiated at 7:07 PM.  Appropriate orders placed.  Jon-Rodney Tsang was informed that the remainder of the evaluation will be completed by another provider, this initial triage assessment does not replace that evaluation, and the importance of remaining in the ED until their evaluation is complete.     Willma Duwaine [place], [place] 02/07/24 1910

## 2024-02-07 NOTE — ED Notes (Signed)
 This RN checked on pt in lobby to assess pain after 4mg  morphine  given in EMS triage. Pt reports pain is still the same but no other complaints at this time.

## 2024-02-08 MED ORDER — ONDANSETRON HCL 4 MG/2ML IJ SOLN
4.0000 mg | Freq: Four times a day (QID) | INTRAMUSCULAR | Status: DC | PRN
Start: 2024-02-08 — End: 2024-02-10
  Administered 2024-02-08: 4 mg via INTRAVENOUS
  Filled 2024-02-08: qty 2

## 2024-02-08 MED ORDER — ACETAMINOPHEN 10 MG/ML IV SOLN
1000.0000 mg | Freq: Four times a day (QID) | INTRAVENOUS | Status: DC
  Administered 2024-02-08: 1000 mg via INTRAVENOUS
  Filled 2024-02-08: qty 100

## 2024-02-08 MED ORDER — MORPHINE SULFATE (PF) 4 MG/ML IV SOLN
4.0000 mg | Freq: Once | INTRAVENOUS | Status: AC
  Administered 2024-02-08: 4 mg via INTRAVENOUS
  Filled 2024-02-08: qty 1

## 2024-02-08 MED ORDER — SODIUM CHLORIDE 0.9 % IV BOLUS
1000.0000 mL | Freq: Once | INTRAVENOUS | Status: AC
  Administered 2024-02-08: 1000 mL via INTRAVENOUS

## 2024-02-08 MED ORDER — MORPHINE SULFATE (PF) 2 MG/ML IV SOLN
1.0000 mg | INTRAVENOUS | Status: DC | PRN
  Administered 2024-02-08 – 2024-02-10 (×11): 1 mg via INTRAVENOUS
  Filled 2024-02-08 (×11): qty 1

## 2024-02-08 MED ORDER — ONDANSETRON HCL 4 MG/2ML IJ SOLN
4.0000 mg | Freq: Once | INTRAMUSCULAR | Status: AC
  Administered 2024-02-08: 4 mg via INTRAVENOUS
  Filled 2024-02-08: qty 2

## 2024-02-08 MED ORDER — LABETALOL HCL 5 MG/ML IV SOLN: 5.0000 mg | Freq: Four times a day (QID) | INTRAVENOUS | Status: DC | PRN

## 2024-02-08 MED ORDER — SODIUM CHLORIDE 0.9 % IV SOLN: INTRAVENOUS | Status: DC

## 2024-02-08 NOTE — ED Notes (Signed)
 Per offgoing nurse, patient refused NG tube placement

## 2024-02-08 NOTE — ED Notes (Signed)
 Primary RN attempted to insert NG tube per orders. Pt unable to tolerate and is refusing NG tube at this time. Primary RN educated pt on risks of not letting us  place NG. Pt still declined. Provider notified.

## 2024-02-08 NOTE — ED Provider Notes (Addendum)
 [place] EMERGENCY DEPARTMENT AT [facility] Provider Note   CSN: [id number] Arrival date & time: 02/07/24  8361     Patient presents with: Abdominal Pain   Robert Howe is a 58 y.o. male.   HPI     This is a 58 year old male with prior history of ventral hernia repair in [place]  who presents with abdominal pain, nausea, vomiting.  States he has not had a bowel movement in 2 to 3 days.  Reports diffuse crampy abdominal pain.  No fevers.  Has not taken anything for his symptoms.  Prior to Admission medications   Medication Sig Start Date End Date Taking? Authorizing Provider  amLODipine (NORVASC) 5 MG tablet Take 5 mg by mouth daily. 03/20/20   [provider]  folic acid  (FOLVITE ) 1 MG tablet Take 1 tablet (1 mg total) by mouth daily. 07/20/22   Mapp, Tavien, MD  Multiple Vitamin (MULTIVITAMIN WITH MINERALS) TABS tablet Take 1 tablet by mouth daily. 07/20/22   Mapp, Tavien, MD  nebivolol  (BYSTOLIC ) 10 MG tablet Take 10 mg by mouth daily. 05/28/22   [provider]  rosuvastatin  (CRESTOR ) 20 MG tablet Take 1 tablet (20 mg total) by mouth daily. 07/20/22 11/24/23  Mapp, Tavien, MD  thiamine  (VITAMIN B-1) 100 MG tablet Take 1 tablet (100 mg total) by mouth daily. 07/20/22   Mapp, Tavien, MD    Allergies: Penicillins, Ibuprofen , and Penicillins    Review of Systems  Constitutional:  Negative for fever.  Respiratory:  Negative for shortness of breath.   Cardiovascular:  Negative for chest pain.  Gastrointestinal:  Positive for abdominal pain, nausea and vomiting.  All other systems reviewed and are negative.   Updated Vital Signs BP (!) 169/97 (BP Location: Right Arm)   Pulse (!) 111   Temp 98.1 F (36.7 C) (Oral)   Resp 16   Ht 1.702 m (5' 7)   Wt 93 kg   SpO2 98%   BMI 32.11 kg/m   Physical Exam Vitals and nursing note reviewed.  Constitutional:      Appearance: He is well-developed. He is ill-appearing. He is not toxic-appearing.  HENT:      Head: Normocephalic and atraumatic.  Eyes:     Pupils: Pupils are equal, round, and reactive to light.  Cardiovascular:     Rate and Rhythm: Regular rhythm. Tachycardia present.     Heart sounds: Normal heart sounds. No murmur heard. Pulmonary:     Effort: Pulmonary effort is normal. No respiratory distress.     Breath sounds: Normal breath sounds. No wheezing.  Abdominal:     General: Bowel sounds are increased.     Palpations: Abdomen is soft.     Tenderness: There is generalized abdominal tenderness. There is guarding. There is no rebound.     Comments: Slight distention, diffuse tenderness to palpation, prior ventral scar noted, no palpable hernia, hyperactive bowel sounds  Musculoskeletal:     Cervical back: Neck supple.  Lymphadenopathy:     Cervical: No cervical adenopathy.  Skin:    General: Skin is warm and dry.  Neurological:     Mental Status: He is alert and oriented to person, place, and time.  Psychiatric:        Mood and Affect: Mood normal.     (all labs ordered are listed, but only abnormal results are displayed) Labs Reviewed  LIPASE, BLOOD - Abnormal; Notable for the following components:      Result Value   Lipase 110 (*)  All other components within normal limits  COMPREHENSIVE METABOLIC PANEL WITH GFR - Abnormal; Notable for the following components:   Glucose, Bld 106 (*)    Total Protein 8.8 (*)    Total Bilirubin 1.9 (*)    Anion gap 16 (*)    All other components within normal limits  CBC - Abnormal; Notable for the following components:   MCV 100.8 (*)    All other components within normal limits  URINALYSIS, ROUTINE W REFLEX MICROSCOPIC - Abnormal; Notable for the following components:   Specific Gravity, Urine 1.033 (*)    Glucose, UA >=500 (*)    Ketones, ur 5 (*)    Protein, ur 30 (*)    Bacteria, UA RARE (*)    All other components within normal limits    EKG: None  Radiology: CT ABDOMEN PELVIS W CONTRAST Result Date:  02/07/2024 EXAM: CT ABDOMEN AND PELVIS WITH CONTRAST 02/07/2024 09:38:00 PM TECHNIQUE: CT of the abdomen and pelvis was performed with the administration of 75 mL of iohexol  (OMNIPAQUE ) 350 MG/ML injection. Multiplanar reformatted images are provided for review. Automated exposure control, iterative reconstruction, and/or weight-based adjustment of the mA/kV was utilized to reduce the radiation dose to as low as reasonably achievable. COMPARISON: 11/02/2021 CLINICAL HISTORY: Abdominal pain, acute, nonlocalized. FINDINGS: LOWER CHEST: Lung bases are clear. A small diaphragmatic hernia is noted, containing fat. LIVER: The liver is within normal limits. GALLBLADDER AND BILE DUCTS: The gallbladder is within normal limits. No biliary ductal dilatation. SPLEEN: The spleen is unremarkable. PANCREAS: The pancreas is unremarkable. ADRENAL GLANDS: The adrenal glands are unremarkable. KIDNEYS, URETERS AND BLADDER: The kidneys demonstrate a normal enhancement pattern bilaterally. Scattered renal cysts are seen. No follow-up is recommended. No stones in the kidneys or ureters. The bladder is decompressed. GI AND BOWEL: The stomach is within normal limits. The appendix is unremarkable. No obstructive or inflammatory changes of the colon are seen. Small bowel shows dilatation of multiple loops with some fecalization of bowel contents immediately below the level of the ventral hernia repair. The overall appearance is similar to that seen on the prior exam and again consistent with partial small bowel obstruction. The transition zone is best seen on axial image number 42 of series 2. This is also noted on the coronal image number series 5, image 99 of 99. These changes are most likely related to adhesions. The distal small bowel is within normal limits. PERITONEUM AND RETROPERITONEUM: No free fluid is seen. No free air. VASCULATURE: Aorta is normal in caliber. LYMPH NODES: No lymphadenopathy. REPRODUCTIVE ORGANS: The prostate is  within normal limits. BONES AND SOFT TISSUES: Postsurgical changes in the right hip are noted. Degenerative changes of the lumbar spine are noted. No acute osseous abnormality. No focal soft tissue abnormality. IMPRESSION: 1. Partial small bowel obstruction, likely related to adhesions, with dilatation of multiple loops and fecalization of bowel contents immediately below the level of the ventral hernia repair; transition zone best seen on axial image 42 of series 2 and coronal image 99 of series 5; findings similar to the prior exam. Electronically signed by: Oneil Devonshire MD 02/07/2024 09:47 PM EST RP Workstation: [id number]     Procedures   Medications Ordered in the ED  sodium chloride  0.9 % bolus 1,000 mL (has no administration in time range)  morphine  (PF) 4 MG/ML injection 4 mg (has no administration in time range)  ondansetron  (ZOFRAN ) injection 4 mg (has no administration in time range)  ondansetron  (ZOFRAN ) injection 4 mg (4  mg Intravenous Given 02/07/24 1707)  morphine  (PF) 2 MG/ML injection 4 mg (4 mg Intravenous Given 02/07/24 1925)  iohexol  (OMNIPAQUE ) 350 MG/ML injection 75 mL (75 mLs Intravenous Contrast Given 02/07/24 2138)    Clinical Course as of 02/08/24 0208  Tue Feb 08, 2024  0207 Patient vomited.  Appears bilious and slightly feculent.  Completely refuses NG tube at this time.  Patient given pain and nausea medication as well as fluids. [CH]    Clinical Course User Index [CH] Deashia Soule, Charmaine FALCON, MD                                 Medical Decision Making Amount and/or Complexity of Data Reviewed Labs: ordered.  Risk Prescription drug management. Decision regarding hospitalization.   This patient presents to the ED for concern of nausea and vomiting with abdominal pain, this involves an extensive number of treatment options, and is a complaint that carries with it a high risk of complications and morbidity.  I considered the following differential and admission for  this acute, potentially life threatening condition.  The differential diagnosis includes recurrent obstruction, gastritis, gastroenteritis, pancreatitis, cholecystitis  MDM:    This is a 58 year old male who presents with abdominal pain, nausea, vomiting.  Actively vomiting.  Diffuse tenderness to palpation without signs of localized peritonitis.  Hyperactive bowel sounds.  Highly suspicious for recurrent bowel obstruction.  Labs reviewed.  No leukocytosis.  Lipase is slightly elevated at 110.  No significant metabolic derangements.  CT scan shows partial SBO.  He has had this in the past and the scan is similar to that.  Looks like it is related to adhesions without a transition point.  See clinical course above.  Patient completely refuses NG tube.  He states that it does not help.  Will plan for admission to the hospitalist.  Will discuss with them as to whether they want general surgery evaluation now or in the morning.  At this time would likely not need any emergent intervention.  (Labs, imaging, consults)  Labs: I Ordered, and personally interpreted labs.  The pertinent results include: CBC, CMP, lipase  Imaging Studies ordered: I ordered imaging studies including CT I independently visualized and interpreted imaging. I agree with the radiologist interpretation  Additional history obtained from chart review.  External records from outside source obtained and reviewed including prior evaluations  Cardiac Monitoring: The patient was maintained on a cardiac monitor.  If on the cardiac monitor, I personally viewed and interpreted the cardiac monitored which showed an underlying rhythm of: Sinus  Reevaluation: After the interventions noted above, I reevaluated the patient and found that they have :stayed the same  Social Determinants of Health:  lives independently  Disposition: Admit  Co morbidities that complicate the patient evaluation  Past Medical History:  Diagnosis Date    Hypertension      Medicines Meds ordered this encounter  Medications   ondansetron  (ZOFRAN ) injection 4 mg   morphine  (PF) 2 MG/ML injection 4 mg   iohexol  (OMNIPAQUE ) 350 MG/ML injection 75 mL   sodium chloride  0.9 % bolus 1,000 mL   morphine  (PF) 4 MG/ML injection 4 mg   ondansetron  (ZOFRAN ) injection 4 mg    I have reviewed the patients home medicines and have made adjustments as needed  Problem List / ED Course: Problem List Items Addressed This Visit       Digestive   SBO (small bowel  obstruction) (HCC) - Primary             Final diagnoses:  SBO (small bowel obstruction) [facility])    ED Discharge Orders     None          Darnetta Kesselman, Charmaine FALCON, MD 02/08/24 9788    Bari Charmaine FALCON, MD 02/08/24 [phone]

## 2024-02-08 NOTE — Consult Note (Signed)
 Reason for Consult:small bowel obstruction Referring Provider: Charmaine Howe, M.D.  Robert Howe is an 58 y.o. male.  HPI: 58 yo male with 2 days of abdominal pain and vomiting. He ate some broccoli that he thinks set it off. He complains of pain mainly on the right side. He has vomited multiple times tonight and in the last 2 days. He has not had flatus or bowel movement in the last 2 days. He has had episodes like this 3 times in the last 5 years.  Past Medical History:  Diagnosis Date   Hypertension     Past Surgical History:  Procedure Laterality Date   HERNIA REPAIR      Family History  Problem Relation Age of Onset   Hypertension Mother     Social History:  reports that he has been smoking cigarettes. He has a 18.5 pack-year smoking history. He has never used smokeless tobacco. He reports current alcohol use of about 4.0 standard drinks of alcohol per week. He reports that he does not currently use drugs.  Allergies:  Allergies  Allergen Reactions   Penicillins Anaphylaxis and Hives   Ibuprofen  Other (See Comments)   Penicillins Hives    Medications: I have reviewed the patient's current medications.  Results for orders placed or performed during the hospital encounter of 02/07/24 (from the past 48 hours)  Lipase, blood     Status: Abnormal   Collection Time: 02/07/24  5:04 PM  Result Value Ref Range   Lipase 110 (H) 11 - 51 U/L    Comment: Performed at [facility], [address], [place], [place] [address]  Comprehensive metabolic panel     Status: Abnormal   Collection Time: 02/07/24  5:04 PM  Result Value Ref Range   Sodium 138 135 - 145 mmol/L   Potassium 4.3 3.5 - 5.1 mmol/L   Chloride 100 98 - 111 mmol/L   CO2 22 22 - 32 mmol/L   Glucose, Bld 106 (H) 70 - 99 mg/dL    Comment: Glucose reference range applies only to samples taken after fasting for at least 8 hours.   BUN 10 6 - 20 mg/dL   Creatinine, Ser 9.16 0.61 - 1.24 mg/dL   Calcium   9.4 8.9 - 10.3 mg/dL   Total Protein 8.8 (H) 6.5 - 8.1 g/dL   Albumin 4.0 3.5 - 5.0 g/dL   AST 19 15 - 41 U/L   ALT 34 0 - 44 U/L   Alkaline Phosphatase 67 38 - 126 U/L   Total Bilirubin 1.9 (H) 0.0 - 1.2 mg/dL   GFR, Estimated >39 >39 mL/min    Comment: (NOTE) Calculated using the CKD-EPI Creatinine Equation (2021)    Anion gap 16 (H) 5 - 15    Comment: Performed at [facility], [address], [place], [place] [address]  CBC     Status: Abnormal   Collection Time: 02/07/24  5:04 PM  Result Value Ref Range   WBC 6.5 4.0 - 10.5 K/uL   RBC 5.00 4.22 - 5.81 MIL/uL   Hemoglobin 16.5 13.0 - 17.0 g/dL   HCT 49.5 60.9 - 47.9 %   MCV 100.8 (H) 80.0 - 100.0 fL   MCH 33.0 26.0 - 34.0 pg   MCHC 32.7 30.0 - 36.0 g/dL   RDW 86.4 88.4 - 84.4 %   Platelets 256 150 - 400 K/uL   nRBC 0.0 0.0 - 0.2 %    Comment: Performed at [facility], [address] [place], [place] [address]  Urinalysis, Routine w reflex microscopic -Urine, Clean Catch     Status: Abnormal   Collection Time: 02/07/24  5:23 PM  Result Value Ref Range   Color, Urine YELLOW YELLOW   APPearance CLEAR CLEAR   Specific Gravity, Urine 1.033 (H) 1.005 - 1.030   pH 5.0 5.0 - 8.0   Glucose, UA >=500 (A) NEGATIVE mg/dL   Hgb urine dipstick NEGATIVE NEGATIVE   Bilirubin Urine NEGATIVE NEGATIVE   Ketones, ur 5 (A) NEGATIVE mg/dL   Protein, ur 30 (A) NEGATIVE mg/dL   Nitrite NEGATIVE NEGATIVE   Leukocytes,Ua NEGATIVE NEGATIVE   RBC / HPF 0-5 0 - 5 RBC/hpf   WBC, UA 0-5 0 - 5 WBC/hpf   Bacteria, UA RARE (A) NONE SEEN   Squamous Epithelial / HPF 0-5 0 - 5 /HPF   Mucus PRESENT     Comment: Performed at [facility], [address] [place], [place] [address]    PE Blood pressure 134/87, pulse 84, temperature 98 F (36.7 C), temperature source Oral, resp. rate 20, height 5' 7 (1.702 m), weight 93 kg, SpO2 95%. Constitutional: NAD; conversant; no deformities Eyes: Moist conjunctiva; no lid lag; anicteric;  PERRL Neck: Trachea midline; no thyromegaly Lungs: Normal respiratory effort; no tactile fremitus CV: RRR; no palpable thrills; no pitting edema GI: Abd soft, distended, slight pain on palpation throughout, supraumbilical midline scar; no palpable hepatosplenomegaly MSK: Normal gait; no clubbing/cyanosis Psychiatric: Appropriate affect; alert and oriented x3 Lymphatic: No palpable cervical or axillary lymphadenopathy Skin: No major subcutaneous nodules. Warm and dry   Assessment/Plan: 58 yo male with small bowel obstruction. I reviewed the Ct scan images showing transition point in the supraumbilical area likely related to scar from hernia repair. I discussed the case with Robert Howe. -I recommend admission to medicine, placement of NG which patient is currently refusing, bowel rest, pain control -will continue discussion around ng tube and contrast protocol  I reviewed last 24 h vitals and pain scores, last 48 h intake and output, last 24 h labs and trends, and last 24 h imaging results.  This care required high  level of medical decision making.   Robert Howe Robert Howe 02/08/2024, 6:43 AM

## 2024-02-08 NOTE — Plan of Care (Signed)

## 2024-02-08 NOTE — H&P (Signed)
 History and Physical    Patient: Robert Howe FMW:[id number] DOB: 03-08-1966 DOA: 02/07/2024 DOS: the patient was seen and examined on 02/08/2024 PCP: [facility]  Patient coming from: Home  Chief Complaint:  Chief Complaint  Patient presents with   Abdominal Pain   HPI: Robert Howe is a 58 y.o. male with medical history significant of HTN, DM2 (treated for DKA at [facility] in 03/2023 and started on metformin), abdominal ventral hernia s/p repair, prior SBO, tobacco and EtOH use disorder who p/w recurrent SBO.  The patient reported experiencing abdominal pain that began suddenly at approximately 3:00 AM. The pain was described as severe and woke the patient from sleep. The patient associated the pain with an inability to pass gas or have a bowel movement. There was a history of similar episodes occurring since moving to [place] , with this being the third such incident. The patient reported vomiting and noted that vomiting increased the pain intensity. The patient did not drive to the hospital due to the severity of the pain and arrived in the afternoon with the assistance of a family member (sister). The patient mentioned being on Ozempic, although not consistently, and suggested that he supposed to take it weekly but often misses doses.  In the ED, pt AFVSS. Labs unremarkable. CT abd showed partial small bowel obstruction, likely related to adhesions, with dilatation of multiple loops and fecalization of bowel contents immediately below the level of the ventral hernia repair. EDP consulted EGS and requested medicine admission.   Review of Systems: As mentioned in the history of present illness. All other systems reviewed and are negative. Past Medical History:  Diagnosis Date   Hypertension    Past Surgical History:  Procedure Laterality Date   HERNIA REPAIR     Social History:  reports that he has been smoking cigarettes. He has a 18.5 pack-year smoking  history. He has never used smokeless tobacco. He reports current alcohol use of about 4.0 standard drinks of alcohol per week. He reports that he does not currently use drugs.  Allergies  Allergen Reactions   Penicillins Anaphylaxis and Hives   Ibuprofen  Hives and Other (See Comments)    Sweating     Family History  Problem Relation Age of Onset   Hypertension Mother     Prior to Admission medications   Medication Sig Start Date End Date Taking? Authorizing Provider  atorvastatin (LIPITOR) 10 MG tablet Take 10 mg by mouth daily.   Yes [provider]  empagliflozin (JARDIANCE) 25 MG TABS tablet Take 12.5 mg by mouth daily.   Yes [provider]  ferrous sulfate 325 (65 FE) MG tablet Take 325 mg by mouth in the morning and at bedtime.   Yes [provider]  gabapentin (NEURONTIN) 300 MG capsule Take 600 mg by mouth 3 (three) times daily.   Yes [provider]  losartan  (COZAAR ) 100 MG tablet Take 50 mg by mouth daily.   Yes [provider]  metFORMIN (GLUCOPHAGE) 500 MG tablet Take 500 mg by mouth in the morning and at bedtime.   Yes [provider]  Semaglutide (OZEMPIC, 0.25 OR 0.5 MG/DOSE, [place]) Inject 0.25 mg into the skin once a week.   Yes [provider]  simethicone  (MYLICON) 80 MG chewable tablet Chew 80 mg by mouth 4 (four) times daily as needed for flatulence.   Yes [provider]  traMADol (ULTRAM) 50 MG tablet Take 50 mg by mouth every 8 (eight) hours as  needed for moderate pain (pain score 4-6) or severe pain (pain score 7-10).   Yes [provider]  amLODipine (NORVASC) 5 MG tablet Take 5 mg by mouth daily. 03/20/20   [provider]  folic acid  (FOLVITE ) 1 MG tablet Take 1 tablet (1 mg total) by mouth daily. 07/20/22   Mapp, Tavien, MD  Multiple Vitamin (MULTIVITAMIN WITH MINERALS) TABS tablet Take 1 tablet by mouth daily. 07/20/22   Mapp, Tavien, MD  nebivolol  (BYSTOLIC ) 10 MG tablet Take 10  mg by mouth daily. 05/28/22   [provider]  rosuvastatin  (CRESTOR ) 20 MG tablet Take 1 tablet (20 mg total) by mouth daily. 07/20/22 11/24/23  Mapp, Tavien, MD  thiamine  (VITAMIN B-1) 100 MG tablet Take 1 tablet (100 mg total) by mouth daily. 07/20/22   Mapp, Tavien, MD    Physical Exam: Vitals:   02/08/24 0530 02/08/24 0600 02/08/24 0610 02/08/24 0619  BP:   134/87   Pulse: 93 98 84   Resp: 10 19 20    Temp:    98 F (36.7 C)  TempSrc:    Oral  SpO2: 92% 91% 95%   Weight:      Height:       General: Alert, oriented x3, resting comfortably in no acute distress HEENT: EOMI, oropharynx clear, moist mucous membranes, hearing intact Neck: Trachea midline and no gross thyromegaly Respiratory: Lungs clear to auscultation bilaterally with normal respiratory effort; no w/r/r Cardiovascular: Regular rate and rhythm w/o m/r/g Abdomen: Soft, nontender, nondistended. Positive bowel sounds MSK: No obvious joint deformities or swelling Skin: No obvious rashes or lesions Neurologic: Awake, alert, spontaneously moves all extremities, strength intact Psychiatric: Appropriate mood and affect, conversational and cooperative   Data Reviewed:  Lab Results  Component Value Date   WBC 6.5 02/07/2024   HGB 16.5 02/07/2024   HCT 50.4 02/07/2024   MCV 100.8 (H) 02/07/2024   PLT 256 02/07/2024   Lab Results  Component Value Date   GLUCOSE 106 (H) 02/07/2024   CALCIUM  9.4 02/07/2024   NA 138 02/07/2024   K 4.3 02/07/2024   CO2 22 02/07/2024   CL 100 02/07/2024   BUN 10 02/07/2024   CREATININE 0.83 02/07/2024   Lab Results  Component Value Date   ALT 34 02/07/2024   AST 19 02/07/2024   ALKPHOS 67 02/07/2024   BILITOT 1.9 (H) 02/07/2024   Lab Results  Component Value Date   INR 1.1 07/18/2022   Radiology: CT ABDOMEN PELVIS W CONTRAST Result Date: 02/07/2024 EXAM: CT ABDOMEN AND PELVIS WITH CONTRAST 02/07/2024 09:38:00 PM TECHNIQUE: CT of the abdomen and pelvis was performed  with the administration of 75 mL of iohexol  (OMNIPAQUE ) 350 MG/ML injection. Multiplanar reformatted images are provided for review. Automated exposure control, iterative reconstruction, and/or weight-based adjustment of the mA/kV was utilized to reduce the radiation dose to as low as reasonably achievable. COMPARISON: 11/02/2021 CLINICAL HISTORY: Abdominal pain, acute, nonlocalized. FINDINGS: LOWER CHEST: Lung bases are clear. A small diaphragmatic hernia is noted, containing fat. LIVER: The liver is within normal limits. GALLBLADDER AND BILE DUCTS: The gallbladder is within normal limits. No biliary ductal dilatation. SPLEEN: The spleen is unremarkable. PANCREAS: The pancreas is unremarkable. ADRENAL GLANDS: The adrenal glands are unremarkable. KIDNEYS, URETERS AND BLADDER: The kidneys demonstrate a normal enhancement pattern bilaterally. Scattered renal cysts are seen. No follow-up is recommended. No stones in the kidneys or ureters. The bladder is decompressed. GI AND BOWEL: The stomach is within normal limits. The appendix is unremarkable.  No obstructive or inflammatory changes of the colon are seen. Small bowel shows dilatation of multiple loops with some fecalization of bowel contents immediately below the level of the ventral hernia repair. The overall appearance is similar to that seen on the prior exam and again consistent with partial small bowel obstruction. The transition zone is best seen on axial image number 42 of series 2. This is also noted on the coronal image number series 5, image 99 of 99. These changes are most likely related to adhesions. The distal small bowel is within normal limits. PERITONEUM AND RETROPERITONEUM: No free fluid is seen. No free air. VASCULATURE: Aorta is normal in caliber. LYMPH NODES: No lymphadenopathy. REPRODUCTIVE ORGANS: The prostate is within normal limits. BONES AND SOFT TISSUES: Postsurgical changes in the right hip are noted. Degenerative changes of the lumbar  spine are noted. No acute osseous abnormality. No focal soft tissue abnormality. IMPRESSION: 1. Partial small bowel obstruction, likely related to adhesions, with dilatation of multiple loops and fecalization of bowel contents immediately below the level of the ventral hernia repair; transition zone best seen on axial image 42 of series 2 and coronal image 99 of series 5; findings similar to the prior exam. Electronically signed by: Oneil Devonshire MD 02/07/2024 09:47 PM EST RP Workstation: [id number]    Assessment and Plan: 72M h/o  HTN, DM2 (treated for DKA at [facility] in 03/2023 and started on metformin), abdominalventral hernia s/p repair, prior SBO, tobacco and EtOH use disorder who p/w recurrent SBO.  SBO -EGS consulted; apprec eval/recs -MIVF: NS at 100cc/h for now -IV Zofran  4mg  q6h prn -IV tylenol  1g QID for now -IV morphine  1mg  q4h prn -NPO -Pt on Ozempic pta; consider d/c this medication at d/c  HTN -HOLD pta losartan  100mg  daily while NPO -IV labetalol  prn SBP >170   Advance Care Planning:   Code Status: Full Code   Consults: EGS  Family Communication: Sister  Severity of Illness: The appropriate patient status for this patient is INPATIENT. Inpatient status is judged to be reasonable and necessary in order to provide the required intensity of service to ensure the patient's safety. The patient's presenting symptoms, physical exam findings, and initial radiographic and laboratory data in the context of their chronic comorbidities is felt to place them at high risk for further clinical deterioration. Furthermore, it is not anticipated that the patient will be medically stable for discharge from the hospital within 2 midnights of admission.   * I certify that at the point of admission it is my clinical judgment that the patient will require inpatient hospital care spanning beyond 2 midnights from the point of admission due to high intensity of service, high risk for further  deterioration and high frequency of surveillance required.*   ------- I spent 56 minutes reviewing previous notes, at the bedside counseling/discussing the treatment plan, and performing clinical documentation.  Author: Marsha Ada, MD 02/08/2024 8:53 AM  For on call review [email or web address].

## 2024-02-09 LAB — HIV ANTIBODY (ROUTINE TESTING W REFLEX): HIV Screen 4th Generation wRfx: NONREACTIVE

## 2024-02-09 MED ORDER — ENOXAPARIN SODIUM 40 MG/0.4ML IJ SOSY
40.0000 mg | PREFILLED_SYRINGE | Freq: Every day | INTRAMUSCULAR | Status: DC
  Administered 2024-02-09: 40 mg via SUBCUTANEOUS
  Filled 2024-02-09: qty 0.4

## 2024-02-09 MED ORDER — LOSARTAN POTASSIUM 50 MG PO TABS
50.0000 mg | ORAL_TABLET | Freq: Every day | ORAL | Status: DC
  Administered 2024-02-09 – 2024-02-10 (×2): 50 mg via ORAL
  Filled 2024-02-09 (×2): qty 1

## 2024-02-09 NOTE — Progress Notes (Signed)
 Subjective/Chief Complaint: PT states he's had a BM yesterday  +flatus mobilizing   Objective: Vital signs in last 24 hours: Temp:  [97.9 F (36.6 C)-99 F (37.2 C)] 98.3 F (36.8 C) (11/26 0435) Pulse Rate:  [75-92] 80 (11/26 0435) Resp:  [14-23] 18 (11/26 0435) BP: (121-150)/(81-97) 129/81 (11/26 0435) SpO2:  [94 %-99 %] 96 % (11/26 0435)    Intake/Output from previous day: No intake/output data recorded. Intake/Output this shift: No intake/output data recorded.  PE:  Constitutional: No acute distress, conversant, appears states age. Eyes: Anicteric sclerae, moist conjunctiva, no lid lag Lungs: Clear to auscultation bilaterally, normal respiratory effort CV: regular rate and rhythm, no murmurs, no peripheral edema, pedal pulses 2+ GI: Soft, no masses or hepatosplenomegaly, non-tender to palpation, min distention Skin: No rashes, palpation reveals normal turgor Psychiatric: appropriate judgment and insight, oriented to person, place, and time   Lab Results:  Recent Labs    02/07/24 1704  WBC 6.5  HGB 16.5  HCT 50.4  PLT 256   BMET Recent Labs    02/07/24 1704  NA 138  K 4.3  CL 100  CO2 22  GLUCOSE 106*  BUN 10  CREATININE 0.83  CALCIUM  9.4   PT/INR No results for input(s): LABPROT, INR in the last 72 hours. ABG No results for input(s): PHART, HCO3 in the last 72 hours.  Invalid input(s): PCO2, PO2  Studies/Results: CT ABDOMEN PELVIS W CONTRAST Result Date: 02/07/2024 EXAM: CT ABDOMEN AND PELVIS WITH CONTRAST 02/07/2024 09:38:00 PM TECHNIQUE: CT of the abdomen and pelvis was performed with the administration of 75 mL of iohexol  (OMNIPAQUE ) 350 MG/ML injection. Multiplanar reformatted images are provided for review. Automated exposure control, iterative reconstruction, and/or weight-based adjustment of the mA/kV was utilized to reduce the radiation dose to as low as reasonably achievable. COMPARISON: 11/02/2021 CLINICAL HISTORY:  Abdominal pain, acute, nonlocalized. FINDINGS: LOWER CHEST: Lung bases are clear. A small diaphragmatic hernia is noted, containing fat. LIVER: The liver is within normal limits. GALLBLADDER AND BILE DUCTS: The gallbladder is within normal limits. No biliary ductal dilatation. SPLEEN: The spleen is unremarkable. PANCREAS: The pancreas is unremarkable. ADRENAL GLANDS: The adrenal glands are unremarkable. KIDNEYS, URETERS AND BLADDER: The kidneys demonstrate a normal enhancement pattern bilaterally. Scattered renal cysts are seen. No follow-up is recommended. No stones in the kidneys or ureters. The bladder is decompressed. GI AND BOWEL: The stomach is within normal limits. The appendix is unremarkable. No obstructive or inflammatory changes of the colon are seen. Small bowel shows dilatation of multiple loops with some fecalization of bowel contents immediately below the level of the ventral hernia repair. The overall appearance is similar to that seen on the prior exam and again consistent with partial small bowel obstruction. The transition zone is best seen on axial image number 42 of series 2. This is also noted on the coronal image number series 5, image 99 of 99. These changes are most likely related to adhesions. The distal small bowel is within normal limits. PERITONEUM AND RETROPERITONEUM: No free fluid is seen. No free air. VASCULATURE: Aorta is normal in caliber. LYMPH NODES: No lymphadenopathy. REPRODUCTIVE ORGANS: The prostate is within normal limits. BONES AND SOFT TISSUES: Postsurgical changes in the right hip are noted. Degenerative changes of the lumbar spine are noted. No acute osseous abnormality. No focal soft tissue abnormality. IMPRESSION: 1. Partial small bowel obstruction, likely related to adhesions, with dilatation of multiple loops and fecalization of bowel contents immediately below the level of the  ventral hernia repair; transition zone best seen on axial image 42 of series 2 and coronal  image 99 of series 5; findings similar to the prior exam. Electronically signed by: Oneil Devonshire MD 02/07/2024 09:47 PM EST RP Workstation: [id number]    Anti-infectives: Anti-infectives (From admission, onward)    None       Assessment/Plan: \58M with SBO -havign Bms will adv to fulls for now and go slow. -con't to mobilize -if no tol liquids may need more discussion for NGT  LOS: 1 day    Lynda Leos 02/09/2024

## 2024-02-09 NOTE — Progress Notes (Signed)
 PROGRESS NOTE    Robert Howe  FMW:[id number] DOB: [birth year] DOA: 02/07/2024 PCP: [facility]    Brief Narrative:  21M h/o HTN, DM2 (treated for DKA at [facility] in 03/2023 and started on metformin), abdominalventral hernia s/p repair, prior SBO, tobacco and EtOH use disorder who p/w recurrent SBO.   Assessment and Plan: SBO -GS consulted -IV Zofran  4mg  q6h prn -IV tylenol  1g QID for now -advance diet as able -says he is having BMs -Pt on Ozempic pta; consider d/c this medication at d/c   HTN -resume home BP Med -IV labetalol  prn SBP >170  DVT prophylaxis: SCDs Start: 02/08/24 0854    Code Status: Full Code Family Communication:   Disposition Plan:  Level of care: Med-Surg Status is: Inpatient     Consultants:  GS     Subjective: No SOB, no CP +BM  Objective: Vitals:   02/08/24 1808 02/08/24 2046 02/09/24 0435 02/09/24 0812  BP: (!) 150/97 131/87 129/81 (!) 135/93  Pulse: 75 79 80 84  Resp: 19 18 18 19   Temp: 99 F (37.2 C) 99 F (37.2 C) 98.3 F (36.8 C) 98.7 F (37.1 C)  TempSrc: Oral Oral Oral Oral  SpO2: 99% 94% 96% 94%  Weight:      Height:        Intake/Output Summary (Last 24 hours) at 02/09/2024 1027 Last data filed at 02/09/2024 0800 Gross per 24 hour  Intake 118 ml  Output --  Net 118 ml   Filed Weights   02/07/24 1651  Weight: 93 kg    Examination:   General: Appearance:    Obese male in no acute distress     Lungs:      respirations unlabored  Heart:    Normal heart rate.     MS:   All extremities are intact.    Neurologic:   Awake, alert       Data Reviewed: I have personally reviewed following labs and imaging studies  CBC: Recent Labs  Lab 02/07/24 1704  WBC 6.5  HGB 16.5  HCT 50.4  MCV 100.8*  PLT 256   Basic Metabolic Panel: Recent Labs  Lab 02/07/24 1704  NA 138  K 4.3  CL 100  CO2 22  GLUCOSE 106*  BUN 10  CREATININE 0.83  CALCIUM  9.4   GFR: Estimated Creatinine  Clearance: 104.2 mL/min (by C-G formula based on SCr of 0.83 mg/dL). Liver Function Tests: Recent Labs  Lab 02/07/24 1704  AST 19  ALT 34  ALKPHOS 67  BILITOT 1.9*  PROT 8.8*  ALBUMIN 4.0   Recent Labs  Lab 02/07/24 1704  LIPASE 110*   No results for input(s): AMMONIA in the last 168 hours. Coagulation Profile: No results for input(s): INR, PROTIME in the last 168 hours. Cardiac Enzymes: No results for input(s): CKTOTAL, CKMB, CKMBINDEX, TROPONINI in the last 168 hours. BNP (last 3 results) No results for input(s): PROBNP in the last 8760 hours. HbA1C: No results for input(s): HGBA1C in the last 72 hours. CBG: No results for input(s): GLUCAP in the last 168 hours. Lipid Profile: No results for input(s): CHOL, HDL, LDLCALC, TRIG, CHOLHDL, LDLDIRECT in the last 72 hours. Thyroid Function Tests: No results for input(s): TSH, T4TOTAL, FREET4, T3FREE, THYROIDAB in the last 72 hours. Anemia Panel: No results for input(s): VITAMINB12, FOLATE, FERRITIN, TIBC, IRON, RETICCTPCT in the last 72 hours. Sepsis Labs: No results for input(s): PROCALCITON, LATICACIDVEN in the last 168 hours.  No results found for this  or any previous visit (from the past 240 hours).       Radiology Studies: CT ABDOMEN PELVIS W CONTRAST Result Date: 02/07/2024 EXAM: CT ABDOMEN AND PELVIS WITH CONTRAST 02/07/2024 09:38:00 PM TECHNIQUE: CT of the abdomen and pelvis was performed with the administration of 75 mL of iohexol  (OMNIPAQUE ) 350 MG/ML injection. Multiplanar reformatted images are provided for review. Automated exposure control, iterative reconstruction, and/or weight-based adjustment of the mA/kV was utilized to reduce the radiation dose to as low as reasonably achievable. COMPARISON: 11/02/2021 CLINICAL HISTORY: Abdominal pain, acute, nonlocalized. FINDINGS: LOWER CHEST: Lung bases are clear. A small diaphragmatic hernia is noted,  containing fat. LIVER: The liver is within normal limits. GALLBLADDER AND BILE DUCTS: The gallbladder is within normal limits. No biliary ductal dilatation. SPLEEN: The spleen is unremarkable. PANCREAS: The pancreas is unremarkable. ADRENAL GLANDS: The adrenal glands are unremarkable. KIDNEYS, URETERS AND BLADDER: The kidneys demonstrate a normal enhancement pattern bilaterally. Scattered renal cysts are seen. No follow-up is recommended. No stones in the kidneys or ureters. The bladder is decompressed. GI AND BOWEL: The stomach is within normal limits. The appendix is unremarkable. No obstructive or inflammatory changes of the colon are seen. Small bowel shows dilatation of multiple loops with some fecalization of bowel contents immediately below the level of the ventral hernia repair. The overall appearance is similar to that seen on the prior exam and again consistent with partial small bowel obstruction. The transition zone is best seen on axial image number 42 of series 2. This is also noted on the coronal image number series 5, image 99 of 99. These changes are most likely related to adhesions. The distal small bowel is within normal limits. PERITONEUM AND RETROPERITONEUM: No free fluid is seen. No free air. VASCULATURE: Aorta is normal in caliber. LYMPH NODES: No lymphadenopathy. REPRODUCTIVE ORGANS: The prostate is within normal limits. BONES AND SOFT TISSUES: Postsurgical changes in the right hip are noted. Degenerative changes of the lumbar spine are noted. No acute osseous abnormality. No focal soft tissue abnormality. IMPRESSION: 1. Partial small bowel obstruction, likely related to adhesions, with dilatation of multiple loops and fecalization of bowel contents immediately below the level of the ventral hernia repair; transition zone best seen on axial image 42 of series 2 and coronal image 99 of series 5; findings similar to the prior exam. Electronically signed by: Oneil Devonshire MD 02/07/2024 09:47 PM  EST RP Workstation: [id number]        Scheduled Meds: Continuous Infusions:  acetaminophen  Stopped (02/08/24 1409)     LOS: 1 day    Time spent: 45 minutes spent on chart review, discussion with nursing staff, consultants, updating family and interview/physical exam; more than 50% of that time was spent in counseling and/or coordination of care.    Harlene RAYMOND Bowl, DO [facility] Available via Epic secure chat 7am-7pm After these hours, please refer to coverage provider listed on [email or web address] 02/09/2024, 10:27 AM

## 2024-02-09 NOTE — Progress Notes (Signed)
 Diet advanced to soft per patient request.  Prior to advancing diet, patient was found with 2 honey buns and a bag of chips. JV

## 2024-02-09 NOTE — Progress Notes (Addendum)
 Patient currently on a full liquid diet per orders and verbalized understanding of dietary restrictions. Patient observed by writer ambulating in hallway with 2 honey buns and 2 bags of chips. Patient reminded of dietary restrictions and education reinforced. Provider made aware. Will continue to monitor.

## 2024-02-10 MED ORDER — POLYETHYLENE GLYCOL 3350 17 G PO PACK: 17.0000 g | PACK | Freq: Every day | ORAL | Status: AC

## 2024-02-10 NOTE — Plan of Care (Signed)

## 2024-02-10 NOTE — Discharge Summary (Signed)
 Physician Discharge Summary  Robert Howe FMW:[id number] DOB: 1965/08/24 DOA: 02/07/2024  PCP: [facility]  Admit date: 02/07/2024 Discharge date: 02/10/2024  Admitted From:  Discharge disposition: home   Recommendations for Outpatient Follow-Up:   Boewl regimen Hold ozempic    Discharge Diagnosis:   Principal Problem:   SBO (small bowel obstruction) (HCC)    Discharge Condition: Improved.  Diet recommendation: .  Regular.  Wound care: None.  Code status: Full.   History of Present Illness:   53M h/o HTN, DM2 (treated for DKA at [facility] in 03/2023 and started on metformin), abdominalventral hernia s/p repair, prior SBO, tobacco and EtOH use disorder who p/w recurrent SBO.    Hospital Course by Problem:   SBO -GS consulted -diet advanced -says he is having BMs -Pt on Ozempic pta; consider d/c this medication at d/c   HTN -resume home BP Med -outpatient adjustment of meds      Medical Consultants:    GS  Discharge Exam:   Vitals:   02/10/24 0339 02/10/24 0759  BP: (!) 140/94 (!) 149/105  Pulse: 70 85  Resp: 16 19  Temp: 98.6 F (37 C) 98.6 F (37 C)  SpO2: 94% 90%   Vitals:   02/09/24 1542 02/09/24 1918 02/10/24 0339 02/10/24 0759  BP: (!) 154/95 (!) 141/97 (!) 140/94 (!) 149/105  Pulse: 82 76 70 85  Resp: 19 18 16 19   Temp: 98 F (36.7 C) 99 F (37.2 C) 98.6 F (37 C) 98.6 F (37 C)  TempSrc: Oral Oral Oral Oral  SpO2: 90% 95% 94% 90%  Weight:      Height:        General exam: Appears calm and comfortable.  The results of significant diagnostics from this hospitalization (including imaging, microbiology, ancillary and laboratory) are listed below for reference.     Procedures and Diagnostic Studies:   CT ABDOMEN PELVIS W CONTRAST Result Date: 02/07/2024 EXAM: CT ABDOMEN AND PELVIS WITH CONTRAST 02/07/2024 09:38:00 PM TECHNIQUE: CT of the abdomen and pelvis was performed with the administration of 75  mL of iohexol  (OMNIPAQUE ) 350 MG/ML injection. Multiplanar reformatted images are provided for review. Automated exposure control, iterative reconstruction, and/or weight-based adjustment of the mA/kV was utilized to reduce the radiation dose to as low as reasonably achievable. COMPARISON: 11/02/2021 CLINICAL HISTORY: Abdominal pain, acute, nonlocalized. FINDINGS: LOWER CHEST: Lung bases are clear. A small diaphragmatic hernia is noted, containing fat. LIVER: The liver is within normal limits. GALLBLADDER AND BILE DUCTS: The gallbladder is within normal limits. No biliary ductal dilatation. SPLEEN: The spleen is unremarkable. PANCREAS: The pancreas is unremarkable. ADRENAL GLANDS: The adrenal glands are unremarkable. KIDNEYS, URETERS AND BLADDER: The kidneys demonstrate a normal enhancement pattern bilaterally. Scattered renal cysts are seen. No follow-up is recommended. No stones in the kidneys or ureters. The bladder is decompressed. GI AND BOWEL: The stomach is within normal limits. The appendix is unremarkable. No obstructive or inflammatory changes of the colon are seen. Small bowel shows dilatation of multiple loops with some fecalization of bowel contents immediately below the level of the ventral hernia repair. The overall appearance is similar to that seen on the prior exam and again consistent with partial small bowel obstruction. The transition zone is best seen on axial image number 42 of series 2. This is also noted on the coronal image number series 5, image 99 of 99. These changes are most likely related to adhesions. The distal small bowel is within normal  limits. PERITONEUM AND RETROPERITONEUM: No free fluid is seen. No free air. VASCULATURE: Aorta is normal in caliber. LYMPH NODES: No lymphadenopathy. REPRODUCTIVE ORGANS: The prostate is within normal limits. BONES AND SOFT TISSUES: Postsurgical changes in the right hip are noted. Degenerative changes of the lumbar spine are noted. No acute osseous  abnormality. No focal soft tissue abnormality. IMPRESSION: 1. Partial small bowel obstruction, likely related to adhesions, with dilatation of multiple loops and fecalization of bowel contents immediately below the level of the ventral hernia repair; transition zone best seen on axial image 42 of series 2 and coronal image 99 of series 5; findings similar to the prior exam. Electronically signed by: Oneil Devonshire MD 02/07/2024 09:47 PM EST RP Workstation: [id number]     Labs:   Basic Metabolic Panel: Recent Labs  Lab 02/07/24 1704  NA 138  K 4.3  CL 100  CO2 22  GLUCOSE 106*  BUN 10  CREATININE 0.83  CALCIUM  9.4   GFR Estimated Creatinine Clearance: 104.2 mL/min (by C-G formula based on SCr of 0.83 mg/dL). Liver Function Tests: Recent Labs  Lab 02/07/24 1704  AST 19  ALT 34  ALKPHOS 67  BILITOT 1.9*  PROT 8.8*  ALBUMIN 4.0   Recent Labs  Lab 02/07/24 1704  LIPASE 110*   No results for input(s): AMMONIA in the last 168 hours. Coagulation profile No results for input(s): INR, PROTIME in the last 168 hours.  CBC: Recent Labs  Lab 02/07/24 1704  WBC 6.5  HGB 16.5  HCT 50.4  MCV 100.8*  PLT 256   Cardiac Enzymes: No results for input(s): CKTOTAL, CKMB, CKMBINDEX, TROPONINI in the last 168 hours. BNP: Invalid input(s): POCBNP CBG: No results for input(s): GLUCAP in the last 168 hours. D-Dimer No results for input(s): DDIMER in the last 72 hours. Hgb A1c No results for input(s): HGBA1C in the last 72 hours. Lipid Profile No results for input(s): CHOL, HDL, LDLCALC, TRIG, CHOLHDL, LDLDIRECT in the last 72 hours. Thyroid function studies No results for input(s): TSH, T4TOTAL, T3FREE, THYROIDAB in the last 72 hours.  Invalid input(s): FREET3 Anemia work up No results for input(s): VITAMINB12, FOLATE, FERRITIN, TIBC, IRON, RETICCTPCT in the last 72 hours. Microbiology No results found for this or any  previous visit (from the past 240 hours).   Discharge Instructions:   Discharge Instructions     Discharge instructions   Complete by: As directed    Soft diet   Increase activity slowly   Complete by: As directed       Allergies as of 02/10/2024       Reactions   Penicillins Anaphylaxis, Hives   Ibuprofen  Hives, Other (See Comments)   Sweating         Medication List     PAUSE taking these medications    OZEMPIC (0.25 OR 0.5 MG/DOSE) [place] Wait to take this until your doctor or other care provider tells you to start again. Inject 0.25 mg into the skin once a week.       STOP taking these medications    gabapentin 300 MG capsule Commonly known as: NEURONTIN       TAKE these medications    atorvastatin 10 MG tablet Commonly known as: LIPITOR Take 10 mg by mouth daily.   ferrous sulfate 325 (65 FE) MG tablet Take 325 mg by mouth daily.   Jardiance 25 MG Tabs tablet Generic drug: empagliflozin Take 12.5 mg by mouth daily.   losartan  100 MG tablet Commonly  known as: COZAAR  Take 50 mg by mouth daily.   metFORMIN 500 MG tablet Commonly known as: GLUCOPHAGE Take 500 mg by mouth in the morning and at bedtime.   polyethylene glycol 17 g packet Commonly known as: MiraLax  Take 17 g by mouth daily.   simethicone  80 MG chewable tablet Commonly known as: MYLICON Chew 80 mg by mouth 4 (four) times daily as needed for flatulence.   traMADol 50 MG tablet Commonly known as: ULTRAM Take 50 mg by mouth every 8 (eight) hours as needed for moderate pain (pain score 4-6) or severe pain (pain score 7-10).        Follow-up Information     [facility] in 1 week(s).   Contact information: [address] [facility] [place] [address] [phone]                  Time coordinating discharge: 45 min  Signed:  Harlene RAYMOND Bowl DO  [facility] 02/10/2024, 9:34 AM

## 2024-02-10 NOTE — Progress Notes (Signed)
   Subjective/Chief Complaint: Reports ongoing bowel function, tolerating a diet and denies any further nausea or vomiting.  Some abdominal soreness from vomiting.  Apparently has advanced himself to regular diet via vending machine   Objective: Vital signs in last 24 hours: Temp:  [98 F (36.7 C)-99 F (37.2 C)] 98.6 F (37 C) (11/27 0759) Pulse Rate:  [70-85] 85 (11/27 0759) Resp:  [16-19] 19 (11/27 0759) BP: (140-154)/(94-105) 154/98 (11/27 1000) SpO2:  [90 %-95 %] 90 % (11/27 0759)    Intake/Output from previous day: 11/26 0701 - 11/27 0700 In: 118 [P.O.:118] Out: -  Intake/Output this shift: No intake/output data recorded.  PE:  Constitutional: No acute distress, conversant, appears states age. Eyes: Anicteric sclerae, moist conjunctiva, no lid lag Lungs: Clear to auscultation bilaterally, normal respiratory effort CV: regular rate and rhythm, no murmurs, no peripheral edema, pedal pulses 2+ GI: Soft, no masses or hepatosplenomegaly, non-tender to palpation, mild distention Skin: No rashes, palpation reveals normal turgor Psychiatric: appropriate judgment and insight, oriented to person, place, and time   Lab Results:  Recent Labs    02/07/24 1704  WBC 6.5  HGB 16.5  HCT 50.4  PLT 256   BMET Recent Labs    02/07/24 1704  NA 138  K 4.3  CL 100  CO2 22  GLUCOSE 106*  BUN 10  CREATININE 0.83  CALCIUM  9.4   PT/INR No results for input(s): LABPROT, INR in the last 72 hours. ABG No results for input(s): PHART, HCO3 in the last 72 hours.  Invalid input(s): PCO2, PO2  Studies/Results: No results found.   Anti-infectives: Anti-infectives (From admission, onward)    None       Assessment/Plan: 37M with SBO Tolerating a diet, having bowel function.  Okay for discharge today.   LOS: 2 days    Mitzie DELENA Freund 02/10/2024
